# Patient Record
Sex: Female | Born: 1985 | Hispanic: No | Marital: Married | Smoking: Never smoker
Health system: Southern US, Community
[De-identification: ages and names within clinical notes are randomized; demographics above are authoritative.]

## PROBLEM LIST (undated history)

## (undated) DIAGNOSIS — D649 Anemia, unspecified: Secondary | ICD-10-CM

## (undated) HISTORY — PX: NO PAST SURGERIES: SHX2092

---

## 2020-01-27 ENCOUNTER — Other Ambulatory Visit: Payer: Self-pay

## 2020-01-27 ENCOUNTER — Inpatient Hospital Stay (HOSPITAL_COMMUNITY)
Admission: EM | Admit: 2020-01-27 | Discharge: 2020-01-28 | Disposition: A | Discharge status: Home / Self Care | Payer: Commercial Managed Care - PPO | Attending: Obstetrics and Gynecology | Admitting: Obstetrics and Gynecology

## 2020-01-27 ENCOUNTER — Encounter (HOSPITAL_COMMUNITY): Payer: Self-pay | Admitting: Physician Assistant

## 2020-01-27 DIAGNOSIS — Z3A01 Less than 8 weeks gestation of pregnancy: Secondary | ICD-10-CM | POA: Diagnosis not present

## 2020-01-27 DIAGNOSIS — O99611 Diseases of the digestive system complicating pregnancy, first trimester: Secondary | ICD-10-CM

## 2020-01-27 DIAGNOSIS — O211 Hyperemesis gravidarum with metabolic disturbance: Secondary | ICD-10-CM | POA: Diagnosis not present

## 2020-01-27 DIAGNOSIS — Z3A08 8 weeks gestation of pregnancy: Secondary | ICD-10-CM | POA: Insufficient documentation

## 2020-01-27 DIAGNOSIS — R7989 Other specified abnormal findings of blood chemistry: Secondary | ICD-10-CM

## 2020-01-27 DIAGNOSIS — E860 Dehydration: Secondary | ICD-10-CM

## 2020-01-27 DIAGNOSIS — K828 Other specified diseases of gallbladder: Secondary | ICD-10-CM

## 2020-01-27 DIAGNOSIS — R7401 Elevation of levels of liver transaminase levels: Secondary | ICD-10-CM

## 2020-01-27 DIAGNOSIS — R1110 Vomiting, unspecified: Secondary | ICD-10-CM

## 2020-01-27 DIAGNOSIS — O210 Mild hyperemesis gravidarum: Secondary | ICD-10-CM | POA: Diagnosis present

## 2020-01-27 DIAGNOSIS — O26611 Liver and biliary tract disorders in pregnancy, first trimester: Secondary | ICD-10-CM | POA: Insufficient documentation

## 2020-01-27 DIAGNOSIS — E876 Hypokalemia: Secondary | ICD-10-CM

## 2020-01-27 MED ORDER — DEXAMETHASONE SODIUM PHOSPHATE 10 MG/ML IJ SOLN
10.0000 mg | Freq: Once | INTRAMUSCULAR | Status: AC
  Administered 2020-01-27: 10 mg via INTRAVENOUS
  Filled 2020-01-27: qty 1

## 2020-01-27 MED ORDER — ONDANSETRON HCL 4 MG/2ML IJ SOLN
4.0000 mg | Freq: Once | INTRAMUSCULAR | Status: AC
  Administered 2020-01-27: 4 mg via INTRAVENOUS
  Filled 2020-01-27: qty 2

## 2020-01-27 MED ORDER — SODIUM CHLORIDE 0.9 % IV SOLN: INTRAVENOUS | Status: DC

## 2020-01-27 MED ORDER — FAMOTIDINE IN NACL 20-0.9 MG/50ML-% IV SOLN
20.0000 mg | Freq: Once | INTRAVENOUS | Status: AC
  Administered 2020-01-27: 20 mg via INTRAVENOUS
  Filled 2020-01-27: qty 50

## 2020-01-27 NOTE — MAU Provider Note (Signed)
Chief Complaint: Emesis   First Provider Initiated Contact with Patient 01/27/20 2237        SUBJECTIVE HPI: Joann Thompson is a 34 y.o. [id number] at [redacted]w[redacted]d by LMP who presents to maternity admissions reporting nausea and vomiting with some epigastric burning and burning in her throat from vomiting.  Has been taking Bonjesta and Promethazine but has not gotten relief from them.  Denies syncope or dizziness. . She denies vaginal bleeding, vaginal itching/burning, urinary symptoms, h/a, dizziness, or fever/chills.    Emesis  This is a recurrent problem. The current episode started in the past 7 days. The problem has been unchanged. The emesis has an appearance of stomach contents. There has been no fever. Pertinent negatives include no abdominal pain (but has epigastric burning), chills, diarrhea, dizziness, fever, headaches or myalgias. Treatments tried: [place]. The treatment provided no relief.   ED Note: Patient is [id number] approximately 9 weeks who presents today for evaluation of hyperemesis.  She states that she has had morning sickness in her previous pregnancies, however since Monday has had a significant worsening in her morning sickness and is unable to keep down food or water.  She has a burning feeling down her throat into her stomach.  No specific chest pain or shortness of breath. She denies any vaginal bleeding.  She states that her husband called the on-call OB who recommended that she be brought in for evaluation and treatment of possible hyperemesis.  History reviewed. No pertinent past medical history. History reviewed. No pertinent surgical history. Social History   Socioeconomic History  . Marital status: Married    Spouse name: Not on file  . Number of children: Not on file  . Years of education: Not on file  . Highest education level: Not on file  Occupational History  . Not on file  Tobacco Use  . Smoking status: Never Smoker  . Smokeless tobacco: Never Used   Substance and Sexual Activity  . Alcohol use: Not Currently  . Drug use: Never  . Sexual activity: Yes  Other Topics Concern  . Not on file  Social History Narrative  . Not on file   Social Determinants of Health   Financial Resource Strain:   . Difficulty of Paying Living Expenses: Not on file  Food Insecurity:   . Worried About Programme researcher, broadcasting/film/video in the Last Year: Not on file  . Ran Out of Food in the Last Year: Not on file  Transportation Needs:   . Lack of Transportation (Medical): Not on file  . Lack of Transportation (Non-Medical): Not on file  Physical Activity:   . Days of Exercise per Week: Not on file  . Minutes of Exercise per Session: Not on file  Stress:   . Feeling of Stress : Not on file  Social Connections:   . Frequency of Communication with Friends and Family: Not on file  . Frequency of Social Gatherings with Friends and Family: Not on file  . Attends Religious Services: Not on file  . Active Member of Clubs or Organizations: Not on file  . Attends Banker Meetings: Not on file  . Marital Status: Not on file  Intimate Partner Violence:   . Fear of Current or Ex-Partner: Not on file  . Emotionally Abused: Not on file  . Physically Abused: Not on file  . Sexually Abused: Not on file   No current facility-administered medications on file prior to encounter.   Current Outpatient Medications on File Prior  to Encounter  Medication Sig Dispense Refill  . Prenatal Vit-Fe Fumarate-FA (PRENATAL MULTIVITAMIN) TABS tablet Take 1 tablet by mouth daily at 12 noon.    . promethazine (PHENERGAN) 12.5 MG tablet Take by mouth every 6 (six) hours as needed for nausea or vomiting.     No Known Allergies  I have reviewed patient's Past Medical Hx, Surgical Hx, Family Hx, Social Hx, medications and allergies.   ROS:  Review of Systems  Constitutional: Negative for chills and fever.  Respiratory: Negative for shortness of breath.   Gastrointestinal:  Positive for nausea and vomiting. Negative for abdominal pain (but has epigastric burning) and diarrhea.  Genitourinary: Negative for difficulty urinating, dysuria, pelvic pain and vaginal bleeding.  Musculoskeletal: Negative for myalgias.  Neurological: Negative for dizziness, syncope and headaches.   Review of Systems  Other systems negative   Physical Exam  Physical Exam  Vitals:   01/27/20 2107 01/27/20 2141 01/28/20 0238  BP: (!) 140/105 (!) 135/91 115/82  Pulse: (!) 117 (!) 109 90  Resp: 20 17 17   Temp: 98.8 F (37.1 C)    TempSrc: Oral    SpO2: 97% 98% 100%  Weight:  55 kg     Constitutional: Well-developed female in no acute distress. She appear ill and uncomfortable, holding epigastric area at times Cardiovascular: mild tachycardia Respiratory: normal effort, no distress GI: Abd soft, non-tender. Pos BS x 4 MS: Extremities nontender, no edema, normal ROM Neurologic: Alert and oriented x 4.  GU: Neg CVAT.  PELVIC EXAM: deferred  LAB RESULTS Results for orders placed or performed during the hospital encounter of 01/27/20 (from the past 24 hour(s))  Urinalysis, Routine w reflex microscopic     Status: Abnormal   Collection Time: 01/28/20 12:45 AM  Result Value Ref Range   Color, Urine AMBER (A) YELLOW   APPearance HAZY (A) CLEAR   Specific Gravity, Urine 1.031 (H) 1.005 - 1.030   pH 6.0 5.0 - 8.0   Glucose, UA NEGATIVE NEGATIVE mg/dL   Hgb urine dipstick NEGATIVE NEGATIVE   Bilirubin Urine NEGATIVE NEGATIVE   Ketones, ur 80 (A) NEGATIVE mg/dL   Protein, ur 100 (A) NEGATIVE mg/dL   Nitrite NEGATIVE NEGATIVE   Leukocytes,Ua NEGATIVE NEGATIVE   RBC / HPF 0-5 0 - 5 RBC/hpf   WBC, UA 0-5 0 - 5 WBC/hpf   Bacteria, UA RARE (A) NONE SEEN   Squamous Epithelial / LPF 0-5 0 - 5   Mucus PRESENT    Hyaline Casts, UA PRESENT   Comprehensive metabolic panel     Status: Abnormal   Collection Time: 01/28/20 12:57 AM  Result Value Ref Range   Sodium 132 (L) 135 - 145  mmol/L   Potassium 3.0 (L) 3.5 - 5.1 mmol/L   Chloride 95 (L) 98 - 111 mmol/L   CO2 19 (L) 22 - 32 mmol/L   Glucose, Bld 104 (H) 70 - 99 mg/dL   BUN 17 6 - 20 mg/dL   Creatinine, Ser 0.82 0.44 - 1.00 mg/dL   Calcium 9.8 8.9 - 10.3 mg/dL   Total Protein 9.0 (H) 6.5 - 8.1 g/dL   Albumin 4.3 3.5 - 5.0 g/dL   AST 39 15 - 41 U/L   ALT 73 (H) 0 - 44 U/L   Alkaline Phosphatase 77 38 - 126 U/L   Total Bilirubin 3.1 (H) 0.3 - 1.2 mg/dL   GFR calc non Af Amer >60 >60 mL/min   GFR calc Af Amer >60 >60 mL/min  Anion gap 18 (H) 5 - 15     IMAGING US ABDOMEN LIMITED RUQ  Result Date: 01/28/2020 CLINICAL DATA:  Abnormal LFTs with abdominal pain, [redacted] weeks pregnant EXAM: ULTRASOUND ABDOMEN LIMITED RIGHT UPPER QUADRANT COMPARISON:  None. FINDINGS: Gallbladder: Well distended without evidence of cholelithiasis. No wall thickening is seen. Mild gallbladder sludge is noted. Common bile duct: Diameter: 5 mm Liver: No focal lesion identified. Within normal limits in parenchymal echogenicity. Portal vein is patent on color Doppler imaging with normal direction of blood flow towards the liver. Other: None. IMPRESSION: Gallbladder sludge without complicating factors. Electronically Signed   By: Alcide Clever M.D.   On: 01/28/2020 02:23    MAU Management/MDM: IV hydration given x 3 liters We gave her Zofran for antiemetic since she had just taken a dose of Promethazine and Bonjesta earlier I also added Pepcid and Decadron for added benefit of reflux and inflammation She felt significantly better after first liter, but urine was dark brown, so we gave additional fluids We checked a CMET and noted elevated ALT and Bilirubin with mild Hypokalemia Consulted Dr Shawnie Pons who recommended RUQ ultrasound This showed only small amount of gallbladder sludge but otherwise normal  Discussed with patient Will supplement her potassium at home and recommend she have her labs repeated Monday or Tuesday to check for adequacy of  repletion and see if the hepatic numbers improve.  They could be elevated due to dehydration and persistent vomiting She is to call office today to schedule that Recommend adding Zofran as backup if Bonjesta and Promethazine fail.  Discussed she may improve simply with rehydration and repletion of K+.    ASSESSMENT SIngle intrauterine pregnancy at [redacted]w[redacted]d Hyperemesis Dehydration Elevated ALT and bilurubin, uncertain cause, probably dehydration Mild hypokalemia, due to loss with vomiting Mild gallbladder sludge  PLAN Discharge home Rx K-Dur daily x4 days Rx Zofran ODT to be used only of other two meds do not work Arboriculturist slowly  Call office today to schedule lab recheck early next week  Pt stable at time of discharge. Encouraged to return here or to other Urgent Care/ED if she develops worsening of symptoms, increase in pain, fever, or other concerning symptoms.    Wynelle Bourgeois CNM, MSN Certified Nurse-Midwife 01/27/2020  10:37 PM

## 2020-01-27 NOTE — ED Provider Notes (Signed)
MSE was initiated and I personally evaluated the patient and placed orders (if any) at  9:14 PM on January 27, 2020.  The patient appears stable so that the remainder of the MSE may be completed by another provider.  Patient is [id number] approximately 9 weeks who presents today for evaluation of hyperemesis.  She states that she has had morning sickness in her previous pregnancies, however since Monday has had a significant worsening in her morning sickness and is unable to keep down food or water.  She has a burning feeling down her throat into her stomach.  No specific chest pain or shortness of breath. She denies any vaginal bleeding.  She states that her husband called the on-call OB who recommended that she be brought in for evaluation and treatment of possible hyperemesis.  She is tachycardic here however does not feel presyncopal.  Her blood pressure is also elevated at 140/105.  Patient is awake and alert, mentating appropriately and able to answer questions appropriately.  While she appears to feel unwell she is in no acute distress.  At this point patient appears stable for transfer to MAU for further evaluation.    Norman Clay 01/27/20 2117    Terrilee Files, MD 01/28/20 1019

## 2020-01-27 NOTE — MAU Note (Signed)
States she is [redacted] weeks pregnant.  Unable to keep any food/drink down since Monday.  Also reports esophageal burning.  She was prescribed phenergan and bonjesta.  Took phenergan for the first time at 2000 and bonjesta BID since Tuesday without relief.  No VB or abdominal pain.

## 2020-01-28 ENCOUNTER — Inpatient Hospital Stay (HOSPITAL_COMMUNITY): Payer: Commercial Managed Care - PPO

## 2020-01-28 LAB — URINALYSIS, ROUTINE W REFLEX MICROSCOPIC
Bilirubin Urine: NEGATIVE
Glucose, UA: NEGATIVE mg/dL
Hgb urine dipstick: NEGATIVE
Ketones, ur: 80 mg/dL — AB
Leukocytes,Ua: NEGATIVE
Nitrite: NEGATIVE
Protein, ur: 100 mg/dL — AB
Specific Gravity, Urine: 1.031 — ABNORMAL HIGH (ref 1.005–1.030)
pH: 6 (ref 5.0–8.0)

## 2020-01-28 LAB — COMPREHENSIVE METABOLIC PANEL
ALT: 73 U/L — ABNORMAL HIGH (ref 0–44)
AST: 39 U/L (ref 15–41)
Albumin: 4.3 g/dL (ref 3.5–5.0)
Alkaline Phosphatase: 77 U/L (ref 38–126)
Anion gap: 18 — ABNORMAL HIGH (ref 5–15)
BUN: 17 mg/dL (ref 6–20)
CO2: 19 mmol/L — ABNORMAL LOW (ref 22–32)
Calcium: 9.8 mg/dL (ref 8.9–10.3)
Chloride: 95 mmol/L — ABNORMAL LOW (ref 98–111)
Creatinine, Ser: 0.82 mg/dL (ref 0.44–1.00)
GFR calc Af Amer: >60 mL/min (ref >60)
GFR calc non Af Amer: >60 mL/min (ref >60)
Glucose, Bld: 104 mg/dL — ABNORMAL HIGH (ref 70–99)
Potassium: 3 mmol/L — ABNORMAL LOW (ref 3.5–5.1)
Sodium: 132 mmol/L — ABNORMAL LOW (ref 135–145)
Total Bilirubin: 3.1 mg/dL — ABNORMAL HIGH (ref 0.3–1.2)
Total Protein: 9 g/dL — ABNORMAL HIGH (ref 6.5–8.1)

## 2020-01-28 MED ORDER — POTASSIUM CHLORIDE CRYS ER 20 MEQ PO TBCR: 20.0000 meq | EXTENDED_RELEASE_TABLET | Freq: Every day | ORAL | 0 refills | Status: AC

## 2020-01-28 MED ORDER — ONDANSETRON 4 MG PO TBDP: 4.0000 mg | ORAL_TABLET | Freq: Four times a day (QID) | ORAL | 0 refills | Status: AC | PRN

## 2020-01-28 NOTE — Discharge Instructions (Signed)
Hypokalemia Hypokalemia means that the amount of potassium in the blood is lower than normal. Potassium is a chemical (electrolyte) that helps regulate the amount of fluid in the body. It also stimulates muscle tightening (contraction) and helps nerves work properly. Normally, most of the body's potassium is inside cells, and only a very small amount is in the blood. Because the amount in the blood is so small, minor changes to potassium levels in the blood can be life-threatening. What are the causes? This condition may be caused by:  Antibiotic medicine.  Diarrhea or vomiting. Taking too much of a medicine that helps you have a bowel movement (laxative) can cause diarrhea and lead to hypokalemia.  Chronic kidney disease (CKD).  Medicines that help the body get rid of excess fluid (diuretics).  Eating disorders, such as bulimia.  Low magnesium levels in the body.  Sweating a lot. What are the signs or symptoms? Symptoms of this condition include:  Weakness.  Constipation.  Fatigue.  Muscle cramps.  Mental confusion.  Skipped heartbeats or irregular heartbeat (palpitations).  Tingling or numbness. How is this diagnosed? This condition is diagnosed with a blood test. How is this treated? This condition may be treated by:  Taking potassium supplements by mouth.  Adjusting the medicines that you take.  Eating more foods that contain a lot of potassium. If your potassium level is very low, you may need to get potassium through an IV and be monitored in the hospital. Follow these instructions at home:   Take over-the-counter and prescription medicines only as told by your health care provider. This includes vitamins and supplements.  Eat a healthy diet. A healthy diet includes fresh fruits and vegetables, whole grains, healthy fats, and lean proteins.  If instructed, eat more foods that contain a lot of potassium. This includes: ? Nuts, such as peanuts and  pistachios. ? Seeds, such as sunflower seeds and pumpkin seeds. ? Peas, lentils, and lima beans. ? Whole grain and bran cereals and breads. ? Fresh fruits and vegetables, such as apricots, avocado, bananas, cantaloupe, kiwi, oranges, tomatoes, asparagus, and potatoes. ? Orange juice. ? Tomato juice. ? Red meats. ? Yogurt.  Keep all follow-up visits as told by your health care provider. This is important. Contact a health care provider if you:  Have weakness that gets worse.  Feel your heart pounding or racing.  Vomit.  Have diarrhea.  Have diabetes (diabetes mellitus) and you have trouble keeping your blood sugar (glucose) in your target range. Get help right away if you:  Have chest pain.  Have shortness of breath.  Have vomiting or diarrhea that lasts for more than 2 days.  Faint. Summary  Dehydration, Adult Dehydration is a condition in which there is not enough water or other fluids in the body. This happens when a person loses more fluids than he or she takes in. Important organs, such as the kidneys, brain, and heart, cannot function without a proper amount of fluids. Any loss of fluids from the body can lead to dehydration. Dehydration can be mild, moderate, or severe. It should be treated right away to prevent it from becoming severe. What are the causes? Dehydration may be caused by:  Conditions that cause loss of water or other fluids, such as diarrhea, vomiting, or sweating or urinating a lot.  Not drinking enough fluids, especially when you are ill or doing activities that require a lot of energy.  Other illnesses and conditions, such as fever or infection.  Certain  medicines, such as medicines that remove excess fluid from the body (diuretics).  Lack of safe drinking water.  Not being able to get enough water and food. What increases the risk? The following factors may make you more likely to develop this condition:  Having a long-term (chronic)  illness that has not been treated properly, such as diabetes, heart disease, or kidney disease.  Being 43 years of age or older.  Having a disability.  Living in a place that is high in altitude, where thinner, drier air causes more fluid loss.  Doing exercises that put stress on your body for a long time (endurance sports). What are the signs or symptoms? Symptoms of dehydration depend on how severe it is. Mild or moderate dehydration  Thirst.  Dry lips or dry mouth.  Dizziness or light-headedness, especially when standing up from a seated position.  Muscle cramps.  Dark urine. Urine may be the color of tea.  Less urine or tears produced than usual.  Headache. Severe dehydration  Changes in skin. Your skin may be cold and clammy, blotchy, or pale. Your skin also may not return to normal after being lightly pinched and released.  Little or no tears, urine, or sweat.  Changes in vital signs, such as rapid breathing and low blood pressure. Your pulse may be weak or may be faster than 100 beats a minute when you are sitting still.  Other changes, such as: ? Feeling very thirsty. ? Sunken eyes. ? Cold hands and feet. ? Confusion. ? Being very tired (lethargic) or having trouble waking from sleep. ? Short-term weight loss. ? Loss of consciousness. How is this diagnosed? This condition is diagnosed based on your symptoms and a physical exam. You may have blood and urine tests to help confirm the diagnosis. How is this treated? Treatment for this condition depends on how severe it is. Treatment should be started right away. Do not wait until dehydration becomes severe. Severe dehydration is an emergency and needs to be treated in a hospital.  Mild or moderate dehydration can be treated at home. You may be asked to: ? Drink more fluids. ? Drink an oral rehydration solution (ORS). This drink helps restore proper amounts of fluids and salts and minerals in the blood  (electrolytes).  Severe dehydration can be treated: ? With IV fluids. ? By correcting abnormal levels of electrolytes. This is often done by giving electrolytes through a tube that is passed through your nose and into your stomach (nasogastric tube, or NG tube). ? By treating the underlying cause of dehydration. Follow these instructions at home: Oral rehydration solution If told by your health care provider, drink an ORS:  Make an ORS by following instructions on the package.  Start by drinking small amounts, about  cup (120 mL) every 5-10 minutes.  Slowly increase how much you drink until you have taken the amount recommended by your health care provider. Eating and drinking         Drink enough clear fluid to keep your urine pale yellow. If you were told to drink an ORS, finish the ORS first and then start slowly drinking other clear fluids. Drink fluids such as: ? Water. Do not drink only water. Doing that can lead to hyponatremia, which is having too little salt (sodium) in the body. ? Water from ice chips you suck on. ? Fruit juice that you have added water to (diluted fruit juice). ? Low-calorie sports drinks.  Eat foods that contain a  healthy balance of electrolytes, such as bananas, oranges, potatoes, tomatoes, and spinach.  Do not drink alcohol.  Avoid the following: ? Drinks that contain a lot of sugar. These include high-calorie sports drinks, fruit juice that is not diluted, and soda. ? Caffeine. ? Foods that are greasy or contain a lot of fat or sugar. General instructions  Take over-the-counter and prescription medicines only as told by your health care provider.  Do not take sodium tablets. Doing that can lead to having too much sodium in the body (hypernatremia).  Return to your normal activities as told by your health care provider. Ask your health care provider what activities are safe for you.  Keep all follow-up visits as told by your health care  provider. This is important. Contact a health care provider if:  You have muscle cramps, pain, or discomfort, such as: ? Pain in your abdomen and the pain gets worse or stays in one area (localizes). ? Stiff neck.  You have a rash.  You are more irritable than usual.  You are sleepier or have a harder time waking than usual.  You feel weak or dizzy.  You feel very thirsty. Get help right away if you have:  Any symptoms of severe dehydration.  Symptoms of vomiting, such as: ? You cannot eat or drink without vomiting. ? Vomiting gets worse or does not go away. ? Vomit includes blood or green matter (bile).  Symptoms that get worse with treatment.  A fever.  A severe headache.  Problems with urination or bowel movements, such as: ? Diarrhea that gets worse or does not go away. ? Blood in your stool (feces). This may cause stool to look black and tarry. ? Not urinating, or urinating only a small amount of very dark urine, within 6-8 hours.  Trouble breathing. These symptoms may represent a serious problem that is an emergency. Do not wait to see if the symptoms will go away. Get medical help right away. Call your local emergency services (911 in the [place] Do not drive yourself to the hospital. Summary  Dehydration is a condition in which there is not enough water or other fluids in the body. This happens when a person loses more fluids than he or she takes in.  Treatment for this condition depends on how severe it is. Treatment should be started right away. Do not wait until dehydration becomes severe.  Drink enough clear fluid to keep your urine pale yellow. If you were told to drink an oral rehydration solution (ORS), finish the ORS first and then start slowly drinking other clear fluids.  Take over-the-counter and prescription medicines only as told by your health care provider.  Get help right away if you have any symptoms of severe dehydration. This information is  not intended to replace advice given to you by your health care provider. Make sure you discuss any questions you have with your health care provider. Document Revised: 07/22/2019 Document Reviewed: 07/22/2019 Elsevier Patient Education  2020 [employer].   Hypokalemia means that the amount of potassium in the blood is lower than normal.  This condition is diagnosed with a blood test.  Hypokalemia may be treated by taking potassium supplements, adjusting the medicines that you take, or eating more foods that are high in potassium.  If your potassium level is very low, you may need to get potassium through an IV and be monitored in the hospital. This information is not intended to replace advice given to you  by your health care provider. Make sure you discuss any questions you have with your health care provider. Document Revised: 07/22/2018 Document Reviewed: 07/22/2018 Elsevier Patient Education  2020 [employer]  Hyperemesis Gravidarum Hyperemesis gravidarum is a severe form of nausea and vomiting that happens during pregnancy. Hyperemesis is worse than morning sickness. It may cause you to have nausea or vomiting all day for many days. It may keep you from eating and drinking enough food and liquids, which can lead to dehydration, malnutrition, and weight loss. Hyperemesis usually occurs during the first half (the first 20 weeks) of pregnancy. It often goes away once a woman is in her second half of pregnancy. However, sometimes hyperemesis continues through an entire pregnancy. What are the causes? The cause of this condition is not known. It may be related to changes in chemicals (hormones) in the body during pregnancy, such as the high level of pregnancy hormone (human chorionic gonadotropin) or the increase in the female sex hormone (estrogen). What are the signs or symptoms? Symptoms of this condition include:  Nausea that does not go away.  Vomiting that does not allow you to  keep any food down.  Weight loss.  Body fluid loss (dehydration).  Having no desire to eat, or not liking food that you have previously enjoyed. How is this diagnosed? This condition may be diagnosed based on:  A physical exam.  Your medical history.  Your symptoms.  Blood tests.  Urine tests. How is this treated? This condition is managed by controlling symptoms. This may include:  Following an eating plan. This can help lessen nausea and vomiting.  Taking prescription medicines. An eating plan and medicines are often used together to help control symptoms. If medicines do not help relieve nausea and vomiting, you may need to receive fluids through an IV at the hospital. Follow these instructions at home: Eating and drinking   Avoid the following: ? Drinking fluids with meals. Try not to drink anything during the 30 minutes before and after your meals. ? Drinking more than 1 cup of fluid at a time. ? Eating foods that trigger your symptoms. These may include spicy foods, coffee, high-fat foods, very sweet foods, and acidic foods. ? Skipping meals. Nausea can be more intense on an empty stomach. If you cannot tolerate food, do not force it. Try sucking on ice chips or other frozen items and make up for missed calories later. ? Lying down within 2 hours after eating. ? Being exposed to environmental triggers. These may include food smells, smoky rooms, closed spaces, rooms with strong smells, warm or humid places, overly loud and noisy rooms, and rooms with motion or flickering lights. Try eating meals in a well-ventilated area that is free of strong smells. ? Quick and sudden changes in your movement. ? Taking iron pills and multivitamins that contain iron. If you take prescription iron pills, do not stop taking them unless your health care provider approves. ? Preparing food. The smell of food can spoil your appetite or trigger nausea.  To help relieve your  symptoms: ? Listen to your body. Everyone is different and has different preferences. Find what works best for you. ? Eat and drink slowly. ? Eat 5-6 small meals daily instead of 3 large meals. Eating small meals and snacks can help you avoid an empty stomach. ? In the morning, before getting out of bed, eat a couple of crackers to avoid moving around on an empty stomach. ? Try eating starchy  foods as these are usually tolerated well. Examples include cereal, toast, bread, potatoes, pasta, rice, and pretzels. ? Include at least 1 serving of protein with your meals and snacks. Protein options include lean meats, poultry, seafood, beans, nuts, nut butters, eggs, cheese, and yogurt. ? Try eating a protein-rich snack before bed. Examples of a protein-rick snack include cheese and crackers or a peanut butter sandwich made with 1 slice of whole-wheat bread and 1 tsp (5 g) of peanut butter. ? Eat or suck on things that have ginger in them. It may help relieve nausea. Add  tsp ground ginger to hot tea or choose ginger tea. ? Try drinking 100% fruit juice or an electrolyte drink. An electrolyte drink contains sodium, potassium, and chloride. ? Drink fluids that are cold, clear, and carbonated or sour. Examples include lemonade, ginger ale, lemon-lime soda, ice water, and sparkling water. ? Brush your teeth or use a mouth rinse after meals. ? Talk with your health care provider about starting a supplement of vitamin B6. General instructions  Take over-the-counter and prescription medicines only as told by your health care provider.  Follow instructions from your health care provider about eating or drinking restrictions.  Continue to take your prenatal vitamins as told by your health care provider. If you are having trouble taking your prenatal vitamins, talk with your health care provider about different options.  Keep all follow-up and pre-birth (prenatal) visits as told by your health care provider.  This is important. Contact a health care provider if:  You have pain in your abdomen.  You have a severe headache.  You have vision problems.  You are losing weight.  You feel weak or dizzy. Get help right away if:  You cannot drink fluids without vomiting.  You vomit blood.  You have constant nausea and vomiting.  You are very weak.  You faint.  You have a fever and your symptoms suddenly get worse. Summary  Hyperemesis gravidarum is a severe form of nausea and vomiting that happens during pregnancy.  Making some changes to your eating habits may help relieve nausea and vomiting.  This condition may be managed with medicine.  If medicines do not help relieve nausea and vomiting, you may need to receive fluids through an IV at the hospital. This information is not intended to replace advice given to you by your health care provider. Make sure you discuss any questions you have with your health care provider. Document Revised: 12/29/2017 Document Reviewed: 08/07/2016 Elsevier Patient Education  [place].

## 2020-02-27 ENCOUNTER — Inpatient Hospital Stay (HOSPITAL_COMMUNITY)
Admission: AD | Admit: 2020-02-27 | Discharge: 2020-02-27 | Disposition: A | Discharge status: Home / Self Care | Payer: Commercial Managed Care - PPO | Attending: Obstetrics and Gynecology | Admitting: Obstetrics and Gynecology

## 2020-02-27 ENCOUNTER — Other Ambulatory Visit: Payer: Self-pay

## 2020-02-27 ENCOUNTER — Encounter (HOSPITAL_COMMUNITY): Payer: Self-pay | Admitting: Obstetrics and Gynecology

## 2020-02-27 DIAGNOSIS — Z3A12 12 weeks gestation of pregnancy: Secondary | ICD-10-CM | POA: Diagnosis not present

## 2020-02-27 DIAGNOSIS — O09521 Supervision of elderly multigravida, first trimester: Secondary | ICD-10-CM | POA: Diagnosis not present

## 2020-02-27 DIAGNOSIS — O209 Hemorrhage in early pregnancy, unspecified: Secondary | ICD-10-CM | POA: Diagnosis not present

## 2020-02-27 LAB — URINALYSIS, ROUTINE W REFLEX MICROSCOPIC
Bacteria, UA: NONE SEEN
Bilirubin Urine: NEGATIVE
Glucose, UA: NEGATIVE mg/dL
Ketones, ur: 20 mg/dL — AB
Leukocytes,Ua: NEGATIVE
Nitrite: NEGATIVE
Protein, ur: 100 mg/dL — AB
RBC / HPF: >50 RBC/hpf — ABNORMAL HIGH (ref 0–5)
Specific Gravity, Urine: 1.014 (ref 1.005–1.030)
pH: 6 (ref 5.0–8.0)

## 2020-02-27 LAB — CBC
HCT: 31.3 % — ABNORMAL LOW (ref 36.0–46.0)
Hemoglobin: 10.1 g/dL — ABNORMAL LOW (ref 12.0–15.0)
MCH: 22.3 pg — ABNORMAL LOW (ref 26.0–34.0)
MCHC: 32.3 g/dL (ref 30.0–36.0)
MCV: 69.1 fL — ABNORMAL LOW (ref 80.0–100.0)
Platelets: 321 10*3/uL (ref 150–400)
RBC: 4.53 MIL/uL (ref 3.87–5.11)
RDW: 19.9 % — ABNORMAL HIGH (ref 11.5–15.5)
WBC: 10.5 10*3/uL (ref 4.0–10.5)
nRBC: 0 % (ref 0.0–0.2)

## 2020-02-27 LAB — WET PREP, GENITAL
Sperm: NONE SEEN
Trich, Wet Prep: NONE SEEN
Yeast Wet Prep HPF POC: NONE SEEN

## 2020-02-27 LAB — POCT PREGNANCY, URINE: Preg Test, Ur: POSITIVE — AB

## 2020-02-27 LAB — ABO/RH: ABO/RH(D): O POS

## 2020-02-27 LAB — HCG, QUANTITATIVE, PREGNANCY: hCG, Beta Chain, Quant, S: 155601 m[IU]/mL — ABNORMAL HIGH (ref <5)

## 2020-02-27 NOTE — Discharge Instructions (Signed)

## 2020-02-27 NOTE — MAU Note (Signed)
Getting ready for bed and felt cramps and went to bathroom and noticed pink then bright red vag spotting when wiped approx 30 min ago.  Last intercourse was yesterday morning. [redacted] weeks pregnant and sees Dr. Tenny Craw.

## 2020-02-27 NOTE — MAU Provider Note (Signed)
History     CSN: [id number]  Arrival date and time: 02/27/20 2104   None     Chief Complaint  Patient presents with  . Vaginal Bleeding  . Abdominal Cramping   HPI Joann Thompson is a 34 y.o. [id number] at [redacted]w[redacted]d who presents to MAU with chief complaint of vaginal bleeding. This is a new problem, onset this evening at approximately 2045 hours. She endorses seeing multiple bright red clots. She denies saturating a menstrual pad. She also denies dysuria, abdominal tenderness, weakness, fever or syncope.  Patient also c/o lower abdominal cramping. This is a new problem, onset coinciding with onset of vaginal bleeding. She rates her pain as 1/10. She denies aggravating or alleviating factors. She has not taken medication or tried other treatments for this complaint.   Patient states she was visiting with her sister this afternoon around 3pm and one of her sister's toddler twins ran towards the street. Patient states she sprinted toward the 36 year old and picked them up to prevent them from running into traffic.Most recent sexual intercourse yesterday morning 02/26/2020.  Patient is s/p New OB appointment with [facility] and her next appointment is 03/14/2020  OB History    Gravida  7   Para  3   Term      Preterm      AB  2   Living  3     SAB      TAB      Ectopic      Multiple      Live Births              No past medical history on file.  No past surgical history on file.  No family history on file.  Social History   Tobacco Use  . Smoking status: Never Smoker  . Smokeless tobacco: Never Used  Substance Use Topics  . Alcohol use: Not Currently  . Drug use: Never    Allergies: No Known Allergies  Medications Prior to Admission  Medication Sig Dispense Refill Last Dose  . ondansetron (ZOFRAN ODT) 4 MG disintegrating tablet Take 1 tablet (4 mg total) by mouth every 6 (six) hours as needed for nausea. 20 tablet 0   . potassium chloride SA (KLOR-CON)  20 MEQ tablet Take 1 tablet (20 mEq total) by mouth daily. 4 tablet 0   . Prenatal Vit-Fe Fumarate-FA (PRENATAL MULTIVITAMIN) TABS tablet Take 1 tablet by mouth daily at 12 noon.     . promethazine (PHENERGAN) 12.5 MG tablet Take by mouth every 6 (six) hours as needed for nausea or vomiting.       Review of Systems  Constitutional: Negative for chills, fatigue and fever.  Gastrointestinal: Positive for abdominal pain.  Genitourinary: Positive for vaginal bleeding. Negative for difficulty urinating, dysuria and hematuria.  Musculoskeletal: Negative for back pain.  Neurological: Negative for dizziness, syncope and weakness.  All other systems reviewed and are negative.  Physical Exam   Blood pressure 121/82, pulse (!) 104, temperature 99.2 F (37.3 C), temperature source Oral, resp. rate 16, height 4\' 11"  (1.499 m), weight 55.8 kg.  Physical Exam  Nursing note and vitals reviewed. Constitutional: She is oriented to person, place, and time. She appears well-developed and well-nourished.  Cardiovascular: Normal rate and normal heart sounds.  Respiratory: Effort normal and breath sounds normal.  GI: Soft. She exhibits no distension. There is no abdominal tenderness. There is no rebound and no guarding.  Genitourinary:    Uterus normal.  Vaginal discharge present.     Genitourinary Comments: Pelvic exam: External genitalia normal, vaginal walls pink and well rugated, scant dark red blood removed with fox swab x 1, cervix visually closed, no lesions noted. Bimanual: cervix 0/thick/posterior, neg CMT, uterus nontender, no adnexal tenderness noted.    Neurological: She is alert and oriented to person, place, and time.  Skin: Skin is warm and dry.  Psychiatric: She has a normal mood and affect. Her behavior is normal. Judgment and thought content normal.    MAU Course/MDM  Procedures: speculum exam, bedside ultrasound  --Viability ultrasound results visible in Care  Everywhere  --Bedside Ultrasound for FHR check: FHT 165, very active fetal movement during scan. Patient informed that the ultrasound is considered a limited obstetric ultrasound and is not intended to be a complete ultrasound exam.  Patient also informed that the ultrasound is not being completed with the intent of assessing for fetal or placental anomalies or any pelvic abnormalities.  Explained that the purpose of today's ultrasound is to assess for fetal heart rate.  Patient acknowledges the purpose of the exam and the limitations of the study.    --Advised pelvic rest until cleared by [facility] provider due to bleeding episode and history of losses.   Patient Vitals for the past 24 hrs:  BP Temp Temp src Pulse Resp Height Weight  02/27/20 2231 126/72 -- -- 95 -- -- --  02/27/20 2146 124/79 -- -- (!) 101 -- -- --  02/27/20 2124 121/82 99.2 F (37.3 C) Oral (!) 104 16 4\' 11"  (1.499 m) 55.8 kg   Results for orders placed or performed during the hospital encounter of 02/27/20 (from the past 24 hour(s))  Pregnancy, urine POC     Status: Abnormal   Collection Time: 02/27/20  9:32 PM  Result Value Ref Range   Preg Test, Ur POSITIVE (A) NEGATIVE  Urinalysis, Routine w reflex microscopic     Status: Abnormal   Collection Time: 02/27/20  9:36 PM  Result Value Ref Range   Color, Urine PINK (A) YELLOW   APPearance CLOUDY (A) CLEAR   Specific Gravity, Urine 1.014 1.005 - 1.030   pH 6.0 5.0 - 8.0   Glucose, UA NEGATIVE NEGATIVE mg/dL   Hgb urine dipstick LARGE (A) NEGATIVE   Bilirubin Urine NEGATIVE NEGATIVE   Ketones, ur 20 (A) NEGATIVE mg/dL   Protein, ur 04/28/20 (A) NEGATIVE mg/dL   Nitrite NEGATIVE NEGATIVE   Leukocytes,Ua NEGATIVE NEGATIVE   RBC / HPF >50 (H) 0 - 5 RBC/hpf   WBC, UA 6-10 0 - 5 WBC/hpf   Bacteria, UA NONE SEEN NONE SEEN   Squamous Epithelial / LPF 21-50 0 - 5   Mucus PRESENT   CBC     Status: Abnormal   Collection Time: 02/27/20  9:40 PM  Result Value Ref Range   WBC 10.5  4.0 - 10.5 K/uL   RBC 4.53 3.87 - 5.11 MIL/uL   Hemoglobin 10.1 (L) 12.0 - 15.0 g/dL   HCT 04/28/20 (L) 28.7 - 86.7 %   MCV 69.1 (L) 80.0 - 100.0 fL   MCH 22.3 (L) 26.0 - 34.0 pg   MCHC 32.3 30.0 - 36.0 g/dL   RDW 67.2 (H) 09.4 - 70.9 %   Platelets 321 150 - 400 K/uL   nRBC 0.0 0.0 - 0.2 %  ABO/Rh     Status: None   Collection Time: 02/27/20  9:40 PM  Result Value Ref Range   ABO/RH(D) O POS  No rh immune globuloin      NOT A RH IMMUNE GLOBULIN CANDIDATE, PT RH POSITIVE Performed at [facility], [address] [place], [place] [address]   Wet prep, genital     Status: Abnormal   Collection Time: 02/27/20  9:53 PM   Specimen: Cervix  Result Value Ref Range   Yeast Wet Prep HPF POC NONE SEEN NONE SEEN   Trich, Wet Prep NONE SEEN NONE SEEN   Clue Cells Wet Prep HPF POC PRESENT (A) NONE SEEN   WBC, Wet Prep HPF POC MODERATE (A) NONE SEEN   Sperm NONE SEEN    Assessment and Plan  --34 y.o. [id number] at [redacted]w[redacted]d  --Episode of vaginal bleeding, VSS --Reassuring fetal surveillance on BSUS --O POS --Discharge home in stable condition with bleeding precautions, pelvic rest  F/U: --Next OB appt [facility] 03/14/2020  Calvert Cantor, CNM 02/27/2020, 10:36 PM

## 2020-02-28 LAB — GC/CHLAMYDIA PROBE AMP (~~LOC~~) NOT AT ARMC
Chlamydia: NEGATIVE
Comment: NEGATIVE
Comment: NORMAL
Neisseria Gonorrhea: NEGATIVE

## 2020-05-24 ENCOUNTER — Encounter (HOSPITAL_COMMUNITY): Payer: Self-pay | Admitting: Obstetrics and Gynecology

## 2020-05-24 ENCOUNTER — Inpatient Hospital Stay (HOSPITAL_COMMUNITY)
Admission: AD | Admit: 2020-05-24 | Discharge: 2020-05-24 | Disposition: A | Discharge status: Home / Self Care | Payer: Commercial Managed Care - PPO | Attending: Obstetrics | Admitting: Obstetrics

## 2020-05-24 ENCOUNTER — Other Ambulatory Visit: Payer: Self-pay

## 2020-05-24 DIAGNOSIS — O99013 Anemia complicating pregnancy, third trimester: Secondary | ICD-10-CM | POA: Insufficient documentation

## 2020-05-24 DIAGNOSIS — Z8759 Personal history of other complications of pregnancy, childbirth and the puerperium: Secondary | ICD-10-CM | POA: Diagnosis not present

## 2020-05-24 DIAGNOSIS — O1412 Severe pre-eclampsia, second trimester: Secondary | ICD-10-CM

## 2020-05-24 DIAGNOSIS — O26893 Other specified pregnancy related conditions, third trimester: Secondary | ICD-10-CM | POA: Insufficient documentation

## 2020-05-24 DIAGNOSIS — Z79899 Other long term (current) drug therapy: Secondary | ICD-10-CM | POA: Insufficient documentation

## 2020-05-24 DIAGNOSIS — Z3A37 37 weeks gestation of pregnancy: Secondary | ICD-10-CM | POA: Diagnosis not present

## 2020-05-24 DIAGNOSIS — M25512 Pain in left shoulder: Secondary | ICD-10-CM | POA: Insufficient documentation

## 2020-05-24 DIAGNOSIS — D649 Anemia, unspecified: Secondary | ICD-10-CM | POA: Insufficient documentation

## 2020-05-24 DIAGNOSIS — Z7982 Long term (current) use of aspirin: Secondary | ICD-10-CM | POA: Insufficient documentation

## 2020-05-24 DIAGNOSIS — Z20822 Contact with and (suspected) exposure to covid-19: Secondary | ICD-10-CM | POA: Insufficient documentation

## 2020-05-24 DIAGNOSIS — O09523 Supervision of elderly multigravida, third trimester: Secondary | ICD-10-CM | POA: Insufficient documentation

## 2020-05-24 DIAGNOSIS — O1413 Severe pre-eclampsia, third trimester: Secondary | ICD-10-CM | POA: Diagnosis not present

## 2020-05-24 DIAGNOSIS — R030 Elevated blood-pressure reading, without diagnosis of hypertension: Secondary | ICD-10-CM | POA: Insufficient documentation

## 2020-05-24 DIAGNOSIS — O09292 Supervision of pregnancy with other poor reproductive or obstetric history, second trimester: Secondary | ICD-10-CM | POA: Diagnosis not present

## 2020-05-24 HISTORY — DX: Anemia, unspecified: D64.9

## 2020-05-24 LAB — SARS CORONAVIRUS 2 BY RT PCR (HOSPITAL ORDER, PERFORMED IN ~~LOC~~ HOSPITAL LAB): SARS Coronavirus 2: NEGATIVE

## 2020-05-24 LAB — URINALYSIS, ROUTINE W REFLEX MICROSCOPIC
Bilirubin Urine: NEGATIVE
Glucose, UA: 50 mg/dL — AB
Hgb urine dipstick: NEGATIVE
Ketones, ur: NEGATIVE mg/dL
Leukocytes,Ua: NEGATIVE
Nitrite: NEGATIVE
Protein, ur: >=300 mg/dL — AB
Specific Gravity, Urine: 1.018 (ref 1.005–1.030)
pH: 6 (ref 5.0–8.0)

## 2020-05-24 LAB — PROTEIN / CREATININE RATIO, URINE
Creatinine, Urine: 114.56 mg/dL
Protein Creatinine Ratio: 9.86 mg/mg{Cre} — ABNORMAL HIGH (ref 0.00–0.15)
Total Protein, Urine: 1130 mg/dL

## 2020-05-24 LAB — COMPREHENSIVE METABOLIC PANEL
ALT: 17 U/L (ref 0–44)
AST: 20 U/L (ref 15–41)
Albumin: 2.1 g/dL — ABNORMAL LOW (ref 3.5–5.0)
Alkaline Phosphatase: 106 U/L (ref 38–126)
Anion gap: 11 (ref 5–15)
BUN: 12 mg/dL (ref 6–20)
CO2: 20 mmol/L — ABNORMAL LOW (ref 22–32)
Calcium: 8.3 mg/dL — ABNORMAL LOW (ref 8.9–10.3)
Chloride: 106 mmol/L (ref 98–111)
Creatinine, Ser: 0.92 mg/dL (ref 0.44–1.00)
GFR calc Af Amer: >60 mL/min (ref >60)
GFR calc non Af Amer: >60 mL/min (ref >60)
Glucose, Bld: 90 mg/dL (ref 70–99)
Potassium: 3.9 mmol/L (ref 3.5–5.1)
Sodium: 137 mmol/L (ref 135–145)
Total Bilirubin: 0.9 mg/dL (ref 0.3–1.2)
Total Protein: 5.7 g/dL — ABNORMAL LOW (ref 6.5–8.1)

## 2020-05-24 LAB — CBC
HCT: 30.4 % — ABNORMAL LOW (ref 36.0–46.0)
Hemoglobin: 9.7 g/dL — ABNORMAL LOW (ref 12.0–15.0)
MCH: 23.4 pg — ABNORMAL LOW (ref 26.0–34.0)
MCHC: 31.9 g/dL (ref 30.0–36.0)
MCV: 73.4 fL — ABNORMAL LOW (ref 80.0–100.0)
Platelets: 280 10*3/uL (ref 150–400)
RBC: 4.14 MIL/uL (ref 3.87–5.11)
RDW: 19.5 % — ABNORMAL HIGH (ref 11.5–15.5)
WBC: 9.4 10*3/uL (ref 4.0–10.5)
nRBC: 0.4 % — ABNORMAL HIGH (ref 0.0–0.2)

## 2020-05-24 MED ORDER — LACTATED RINGERS IV SOLN: INTRAVENOUS | Status: DC

## 2020-05-24 MED ORDER — LABETALOL HCL 5 MG/ML IV SOLN
40.0000 mg | INTRAVENOUS | Status: DC | PRN
  Administered 2020-05-24: 40 mg via INTRAVENOUS
  Filled 2020-05-24: qty 8

## 2020-05-24 MED ORDER — LABETALOL HCL 5 MG/ML IV SOLN
INTRAVENOUS | Status: AC
  Administered 2020-05-24: 20 mg via INTRAVENOUS
  Filled 2020-05-24: qty 4

## 2020-05-24 MED ORDER — LABETALOL HCL 5 MG/ML IV SOLN: 20.0000 mg | INTRAVENOUS | Status: DC | PRN

## 2020-05-24 MED ORDER — BETAMETHASONE SOD PHOS & ACET 6 (3-3) MG/ML IJ SUSP
12.0000 mg | INTRAMUSCULAR | Status: DC
  Administered 2020-05-24: 12 mg via INTRAMUSCULAR
  Filled 2020-05-24: qty 5

## 2020-05-24 MED ORDER — MAGNESIUM SULFATE 40 GM/1000ML IV SOLN
2.0000 g/h | INTRAVENOUS | Status: DC
  Administered 2020-05-24: 2 g/h via INTRAVENOUS
  Filled 2020-05-24: qty 1000

## 2020-05-24 MED ORDER — LABETALOL HCL 5 MG/ML IV SOLN
80.0000 mg | INTRAVENOUS | Status: DC | PRN
  Administered 2020-05-24: 80 mg via INTRAVENOUS
  Filled 2020-05-24: qty 16

## 2020-05-24 MED ORDER — MAGNESIUM SULFATE BOLUS VIA INFUSION
4.0000 g | Freq: Once | INTRAVENOUS | Status: AC
  Administered 2020-05-24: 4 g via INTRAVENOUS
  Filled 2020-05-24: qty 1000

## 2020-05-24 MED ORDER — CYCLOBENZAPRINE HCL 5 MG PO TABS
10.0000 mg | ORAL_TABLET | Freq: Once | ORAL | Status: AC
  Administered 2020-05-24: 10 mg via ORAL
  Filled 2020-05-24: qty 2

## 2020-05-24 MED ORDER — HYDRALAZINE HCL 20 MG/ML IJ SOLN: 10.0000 mg | INTRAMUSCULAR | Status: DC | PRN

## 2020-05-24 NOTE — H&P (Signed)
34 y.o. [id number] @ [redacted]w[redacted]d presents with elevated blood pressure.  She has a history of preeclampsia with G1, but denies history of chronic hypertension.  She was noted at 22 weeks to have new onset 2+ proteinuria.  Short interval follow up at 24 weeks on 5/28 revealed new onset hypertension with BPs 130-142/90.  Labs were normal with the exception of an elevated urine protein: creatinine ratio of  She was seen in the office yesterday again with BPs of 148/90s.  She has been monitoring her BPs at home and today noted a BP of 167/102.  She presented to MAU for evaluation. She had multiple severe range BPs.  The anti-hypertensive protocol was activated.  She received 3 doses of labetalol (20, 40, 80mg ) and now BPs are 140-150s/80-90s.  She denies headache, visual changes, epigastric or abdominal pain.  Endorsed mild shoulder pain that resolved with flexeril.     Pregnancy c/b:  1. History of preeclampsia--with G1, delivered at 37 weeks.  No HTN with G2/G3--both delivered at 40 weeks.  Started low dose aspirin at 22 weeks this pregnancy 2. Advanced maternal age--NIPT low risk  Past Medical History:  Diagnosis Date  . Anemia     Past Surgical History:  Procedure Laterality Date  . NO PAST SURGERIES      OB History  Gravida Para Term Preterm AB Living  6 3 3   2 3   SAB TAB Ectopic Multiple Live Births          3    # Outcome Date GA Lbr Len/2nd Weight Sex Delivery Anes PTL Lv  6 Current           5 Term 01/20/10 [redacted]w[redacted]d   F Vag-Spont   LIV  4 Term 01/27/07 [redacted]w[redacted]d   M Vag-Spont   LIV  3 Term 11/23/03 [redacted]w[redacted]d   F Vag-Spont   LIV  2 AB           1 AB               Social History   Socioeconomic History  . Marital status: Married    Spouse name: Not on file  . Number of children: Not on file  . Years of education: Not on file  . Highest education level: Not on file  Occupational History  . Not on file  Tobacco Use  . Smoking status: Never Smoker  . Smokeless tobacco: Never Used  Substance  and Sexual Activity  . Alcohol use: Not Currently  . Drug use: Never  . Sexual activity: Yes  Other Topics Concern  . Not on file  Social History Narrative  . Not on file   Social Determinants of Health     Patient has no known allergies.     ABO, Rh: --/--/O POS (03/07 2140) Antibody:  Negative Rubella:  Immune RPR:   NR HBsAg:   Negative HIV:   negative GBS:   Unknown    Vitals:   05/24/20 1910 05/24/20 1919  BP: (!) 145/98 (!) 151/94  Pulse: 84 80  Resp:  17  Temp:    SpO2: 96% 96%     General:  NAD HEENT:  No facial edema Abdomen:  soft, gravid Ex:  No lower extremity edema SVE:  deffered FHTs:  150s, moderate variability, appropriate for gestational age Toco:  quiet   A/P   34 y.o. [id number] [redacted]w[redacted]d presents with new onset severe preeclampsia NICU is closed to admission.  [facility] contacted and Dr. Lebron Conners with  MFM accepts the transfer  Prior to transfer, she received BMZ dose #1 (1820) and magnesium sulfate 4gm bolus (1840)      Hong Moring GEFFEL [employer]

## 2020-05-24 NOTE — MAU Provider Note (Signed)
Chief Complaint:  Hypertension and Shoulder Pain   First Provider Initiated Contact with Patient 05/24/20 1509      HPI: Joann Thompson is a 34 y.o. [id number] at [redacted]w[redacted]d who presents to maternity admissions sent from her office for elevated BP at home. She reports elevated BP in the office 2 weeks ago, then again yesterday in the office. She started taking BPs with a home cuff and called her office today with severe range BPs of 160s/100s.  She denies h/a, epigastric pain or visual disturbances. She does have left shoulder pain that is constant when at rest, improves with movement. She has not tried any treatments.  She reports good fetal movement.  She has hx preeclampsia with one previous pregnancy.  Location: left shoulder Quality: sharp Severity: 6/10 on pain scale Duration:lasting minutes to hours, varies Timing: intermittent Modifying factors: movement makes pain better, worse at rest Associated signs and symptoms: none  HPI  Past Medical History: Past Medical History:  Diagnosis Date  . Anemia     Past obstetric history: OB History  Gravida Para Term Preterm AB Living  6 3 3   2 3   SAB TAB Ectopic Multiple Live Births          3    # Outcome Date GA Lbr Len/2nd Weight Sex Delivery Anes PTL Lv  6 Current           5 Term 01/20/10 [redacted]w[redacted]d   F Vag-Spont   LIV  4 Term 01/27/07 [redacted]w[redacted]d   M Vag-Spont   LIV  3 Term 11/23/03 105w0d   F Vag-Spont   LIV  2 AB           1 AB             Past Surgical History: Past Surgical History:  Procedure Laterality Date  . NO PAST SURGERIES      Family History: No family history on file.  Social History: Social History   Tobacco Use  . Smoking status: Never Smoker  . Smokeless tobacco: Never Used  Substance Use Topics  . Alcohol use: Not Currently  . Drug use: Never    Allergies: No Known Allergies  Meds:  Medications Prior to Admission  Medication Sig Dispense Refill Last Dose  . aspirin EC 81 MG tablet Take 81 mg by mouth  daily.     . ferrous sulfate 325 (65 FE) MG tablet Take 325 mg by mouth daily with breakfast.     . Prenatal Vit-Fe Fumarate-FA (PRENATAL MULTIVITAMIN) TABS tablet Take 1 tablet by mouth daily at 12 noon.   05/24/2020 at Unknown time  . ondansetron (ZOFRAN ODT) 4 MG disintegrating tablet Take 1 tablet (4 mg total) by mouth every 6 (six) hours as needed for nausea. 20 tablet 0   . potassium chloride SA (KLOR-CON) 20 MEQ tablet Take 1 tablet (20 mEq total) by mouth daily. 4 tablet 0   . promethazine (PHENERGAN) 12.5 MG tablet Take by mouth every 6 (six) hours as needed for nausea or vomiting.       ROS:  Review of Systems  Constitutional: Negative for chills, fatigue and fever.  Eyes: Negative for visual disturbance.  Respiratory: Negative for shortness of breath.   Cardiovascular: Negative for chest pain.  Gastrointestinal: Negative for abdominal pain, nausea and vomiting.  Genitourinary: Negative for difficulty urinating, dysuria, flank pain, pelvic pain, vaginal bleeding, vaginal discharge and vaginal pain.  Musculoskeletal: Positive for myalgias.  Neurological: Negative for dizziness and headaches.  Psychiatric/Behavioral:  Negative.      I have reviewed patient's Past Medical Hx, Surgical Hx, Family Hx, Social Hx, medications and allergies.   Physical Exam   Patient Vitals for the past 24 hrs:  BP Temp Temp src Pulse Resp SpO2 Height Weight  05/24/20 1856 -- 99.3 F (37.4 C) Oral -- 16 99 % -- --  05/24/20 1851 (!) 157/94 -- -- 90 -- -- -- --  05/24/20 1841 (!) 148/90 -- -- 78 -- -- -- --  05/24/20 1831 (!) 152/99 -- -- 79 -- -- -- --  05/24/20 1811 (!) 149/98 -- -- 78 -- -- -- --  05/24/20 1801 (!) 148/92 -- -- 90 -- -- -- --  05/24/20 1751 (!) 150/94 -- -- 77 -- -- -- --  05/24/20 1741 (!) 145/100 -- -- 82 -- -- -- --  05/24/20 1731 (!) 152/101 -- -- 78 -- -- -- --  05/24/20 1721 (!) 150/93 -- -- 79 -- -- -- --  05/24/20 1711 (!) 146/98 -- -- 80 -- -- -- --  05/24/20 1701  (!) 148/97 -- -- 81 -- -- -- --  05/24/20 1651 (!) 142/99 -- -- 83 -- -- -- --  05/24/20 1630 (!) 170/101 -- -- 87 -- 98 % -- --  05/24/20 1620 (!) 159/101 -- -- 77 -- 97 % -- --  05/24/20 1611 (!) 159/101 -- -- 78 -- 97 % -- --  05/24/20 1600 (!) 155/103 -- -- 84 -- 98 % -- --  05/24/20 1551 (!) 146/99 -- -- 83 -- 96 % -- --  05/24/20 1541 (!) 152/100 -- -- 82 -- -- -- --  05/24/20 1530 (!) 165/109 -- -- 89 -- 97 % -- --  05/24/20 1520 (!) 158/101 -- -- 85 -- 97 % -- --  05/24/20 1511 (!) 153/102 -- -- 84 -- -- -- --  05/24/20 1500 (!) 166/101 -- -- 84 -- 98 % -- --  05/24/20 1456 (!) 158/104 -- -- 83 -- -- -- --  05/24/20 1432 (!) 160/106 99 F (37.2 C) -- 100 18 -- 4\' 11"  (1.499 m) 63.5 kg   Constitutional: Well-developed, well-nourished female in no acute distress.  Cardiovascular: normal rate Respiratory: normal effort GI: Abd soft, non-tender, gravid appropriate for gestational age.  MS: Extremities nontender, no edema, normal ROM Neurologic: Alert and oriented x 4.  GU: Neg CVAT.  PELVIC EXAM: Deferred     FHT:  Baseline 145 , moderate variability, 10 x 10 accelerations present, no decelerations.  NST reactive/appropriate for gestational age. Contractions: some intermittent irritability   Labs: Results for orders placed or performed during the hospital encounter of 05/24/20 (from the past 24 hour(s))  Urinalysis, Routine w reflex microscopic     Status: Abnormal   Collection Time: 05/24/20  2:44 PM  Result Value Ref Range   Color, Urine YELLOW YELLOW   APPearance HAZY (A) CLEAR   Specific Gravity, Urine 1.018 1.005 - 1.030   pH 6.0 5.0 - 8.0   Glucose, UA 50 (A) NEGATIVE mg/dL   Hgb urine dipstick NEGATIVE NEGATIVE   Bilirubin Urine NEGATIVE NEGATIVE   Ketones, ur NEGATIVE NEGATIVE mg/dL   Protein, ur 07/24/20 (A) NEGATIVE mg/dL   Nitrite NEGATIVE NEGATIVE   Leukocytes,Ua NEGATIVE NEGATIVE   RBC / HPF 0-5 0 - 5 RBC/hpf   WBC, UA 0-5 0 - 5 WBC/hpf   Bacteria, UA  RARE (A) NONE SEEN   Squamous Epithelial / LPF 6-10 0 - 5   Mucus  PRESENT    Hyaline Casts, UA PRESENT    Granular Casts, UA PRESENT    Amorphous Crystal PRESENT   Protein / creatinine ratio, urine     Status: Abnormal   Collection Time: 05/24/20  2:44 PM  Result Value Ref Range   Creatinine, Urine 114.56 mg/dL   Total Protein, Urine 1,130 mg/dL   Protein Creatinine Ratio 9.86 (H) 0.00 - 0.15 mg/mg[Cre]  CBC     Status: Abnormal   Collection Time: 05/24/20  3:04 PM  Result Value Ref Range   WBC 9.4 4.0 - 10.5 K/uL   RBC 4.14 3.87 - 5.11 MIL/uL   Hemoglobin 9.7 (L) 12.0 - 15.0 g/dL   HCT 30.4 (L) 36.0 - 46.0 %   MCV 73.4 (L) 80.0 - 100.0 fL   MCH 23.4 (L) 26.0 - 34.0 pg   MCHC 31.9 30.0 - 36.0 g/dL   RDW 19.5 (H) 11.5 - 15.5 %   Platelets 280 150 - 400 K/uL   nRBC 0.4 (H) 0.0 - 0.2 %  Comprehensive metabolic panel     Status: Abnormal   Collection Time: 05/24/20  3:04 PM  Result Value Ref Range   Sodium 137 135 - 145 mmol/L   Potassium 3.9 3.5 - 5.1 mmol/L   Chloride 106 98 - 111 mmol/L   CO2 20 (L) 22 - 32 mmol/L   Glucose, Bld 90 70 - 99 mg/dL   BUN 12 6 - 20 mg/dL   Creatinine, Ser 0.92 0.44 - 1.00 mg/dL   Calcium 8.3 (L) 8.9 - 10.3 mg/dL   Total Protein 5.7 (L) 6.5 - 8.1 g/dL   Albumin 2.1 (L) 3.5 - 5.0 g/dL   AST 20 15 - 41 U/L   ALT 17 0 - 44 U/L   Alkaline Phosphatase 106 38 - 126 U/L   Total Bilirubin 0.9 0.3 - 1.2 mg/dL   GFR calc non Af Amer >60 >60 mL/min   GFR calc Af Amer >60 >60 mL/min   Anion gap 11 5 - 15   --/--/O POS (03/07 2140)  Imaging:  No results found.  MAU Course/MDM: Orders Placed This Encounter  Procedures  . SARS Coronavirus 2 by RT PCR (hospital order, performed in [facility] hospital lab) Nasopharyngeal Nasopharyngeal Swab  . Urinalysis, Routine w reflex microscopic  . Protein / creatinine ratio, urine  . CBC  . Comprehensive metabolic panel  . Notify Physician  . Measure blood pressure    Meds ordered this encounter   Medications  . labetalol (NORMODYNE) 5 MG/ML injection    Druebbisch, Elmyra Ricks  : cabinet override  . AND Linked Order Group   . labetalol (NORMODYNE) injection 20 mg   . labetalol (NORMODYNE) injection 40 mg   . labetalol (NORMODYNE) injection 80 mg   . hydrALAZINE (APRESOLINE) injection 10 mg  . cyclobenzaprine (FLEXERIL) tablet 10 mg  . betamethasone acetate-betamethasone sodium phosphate (CELESTONE) injection 12 mg  . magnesium bolus via infusion 4 g  . magnesium sulfate 40 grams in SWI 1000 mL OB infusion  . lactated ringers infusion     NST reviewed and reactive or appropriate for gestational age BP severe range here in MAU, no s/sx of PEC Pt with shoulder pain improved with Flexeril Labetalol x 3 doses given per protocol with improved BP, remain elevated but no longer severe range Consult Dr Elly Modena with presentation, exam findings and test results.  Recommend admission for observation, BP management, and Betamethasone in anticipation of early delivery Called Dr Carlis Abbott  who presents to MAU to see and admit the patient NICU with no beds so pt to transfer to another facility, Dr Chestine Spore to call transfer center for bed    Assessment: 1. Preeclampsia, severe, second trimester     Plan: Admit Magnesium sulfate 4g bolus/2g/hour Betamethasone x 1 dose given Pt to transfer to facility with available NICU services  Sharen Counter Certified Nurse-Midwife 05/24/2020 7:01 PM

## 2020-05-24 NOTE — MAU Note (Signed)
Pt stated they have been monitoring her b/p a because it has been high. It was 160/102 at home.  Denies any headache or visual changes.Pt also c/o left shoulder blade pain x 3 days. Pain is sharp and hurts at rest. Good fetal movement reported.

## 2020-05-26 MED ORDER — SODIUM CHLORIDE FLUSH 0.9 % IV SOLN
10.00 | INTRAVENOUS | Status: DC
Start: 2020-05-29 — End: 2020-05-26

## 2020-05-26 MED ORDER — FERROUS SULFATE 325 (65 FE) MG PO TABS
325.00 | ORAL_TABLET | ORAL | Status: DC
Start: 2020-05-30 — End: 2020-05-26

## 2020-05-26 MED ORDER — PRENATAL 28-0.8 MG PO TABS
1.00 | ORAL_TABLET | ORAL | Status: DC
Start: 2020-05-30 — End: 2020-05-26

## 2020-05-26 MED ORDER — ACETAMINOPHEN 325 MG PO TABS
650.00 | ORAL_TABLET | ORAL | Status: DC
Start: ? — End: 2020-05-26

## 2020-05-26 MED ORDER — NIFEDIPINE ER OSMOTIC RELEASE 30 MG PO TB24
30.00 | ORAL_TABLET | ORAL | Status: DC
Start: 2020-05-26 — End: 2020-05-26

## 2020-05-26 MED ORDER — LACTATED RINGERS IV SOLN
INTRAVENOUS | Status: DC
Start: ? — End: 2020-05-26

## 2020-05-26 MED ORDER — SODIUM CHLORIDE FLUSH 0.9 % IV SOLN
10.00 | INTRAVENOUS | Status: DC
Start: ? — End: 2020-05-26

## 2020-05-26 MED ORDER — GENERIC EXTERNAL MEDICATION
Status: DC
Start: ? — End: 2020-05-26

## 2020-05-26 MED ORDER — FAMOTIDINE 20 MG PO TABS
20.00 | ORAL_TABLET | ORAL | Status: DC
Start: ? — End: 2020-05-26

## 2020-05-26 MED ORDER — LACTATED RINGERS IV SOLN
500.00 | INTRAVENOUS | Status: DC
Start: ? — End: 2020-05-26

## 2020-05-29 MED ORDER — GENERIC EXTERNAL MEDICATION
Status: DC
Start: ? — End: 2020-05-29

## 2020-05-29 MED ORDER — SIMETHICONE 80 MG PO CHEW
80.00 | CHEWABLE_TABLET | ORAL | Status: DC
Start: ? — End: 2020-05-29

## 2020-05-29 MED ORDER — NIFEDIPINE ER OSMOTIC RELEASE 30 MG PO TB24
60.00 | ORAL_TABLET | ORAL | Status: DC
Start: 2020-05-29 — End: 2020-05-29

## 2020-05-29 MED ORDER — NIFEDIPINE ER OSMOTIC RELEASE 30 MG PO TB24
60.00 | ORAL_TABLET | ORAL | Status: DC
Start: 2020-05-30 — End: 2020-05-29

## 2021-01-06 IMAGING — US US ABDOMEN LIMITED
1 series · 15 of 25 positions shown · non-contrast
Comparison: None.

CLINICAL DATA: Abnormal LFTs with abdominal pain, 8 weeks pregnant

EXAM:
ULTRASOUND ABDOMEN LIMITED RIGHT UPPER QUADRANT

[Series 1: us abdomen limited · 15 of 54 slices shown]
[im 1/54]
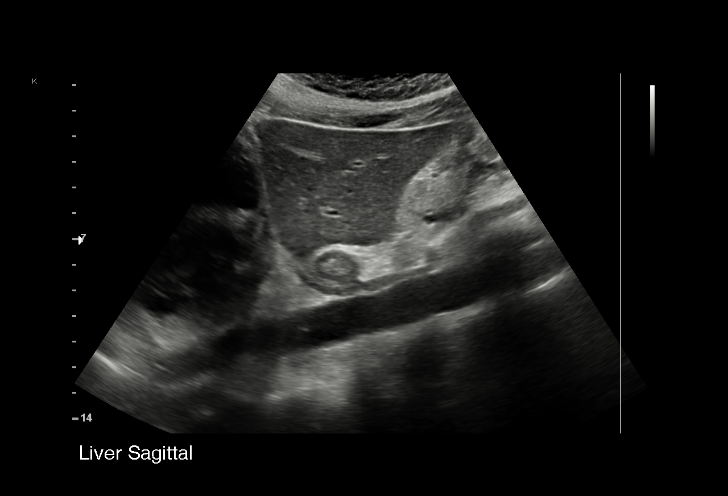
[im 5/54]
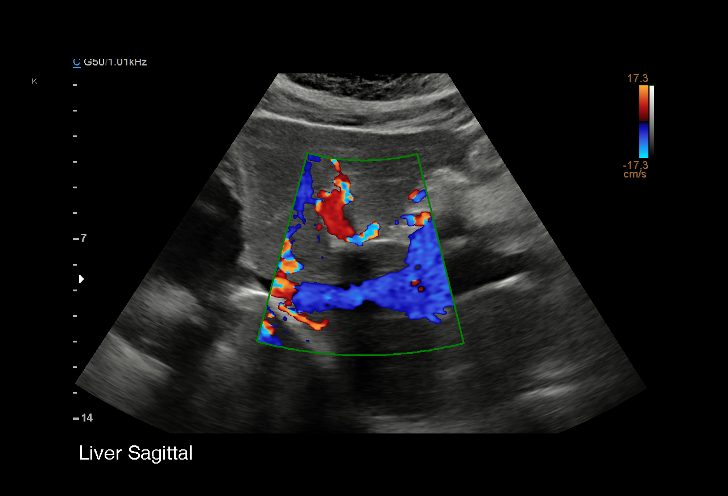
[im 9/54]
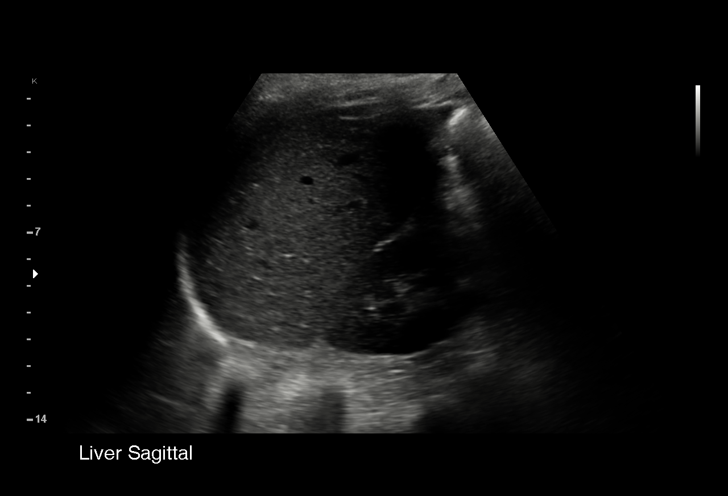
[im 12/54]
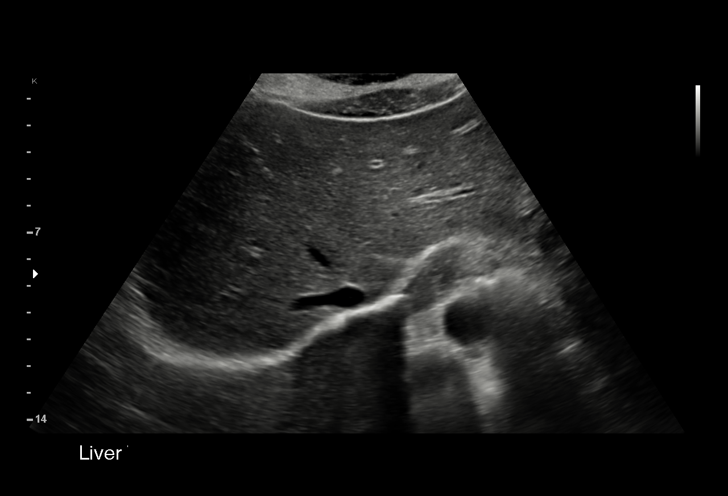
[im 16/54]
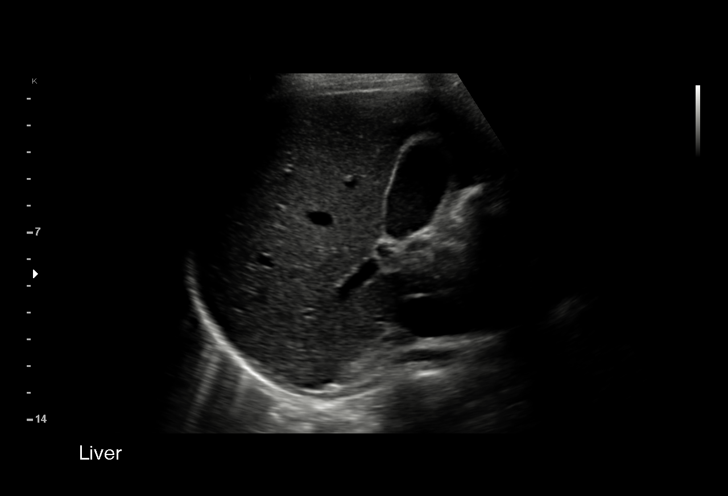
[im 20/54]
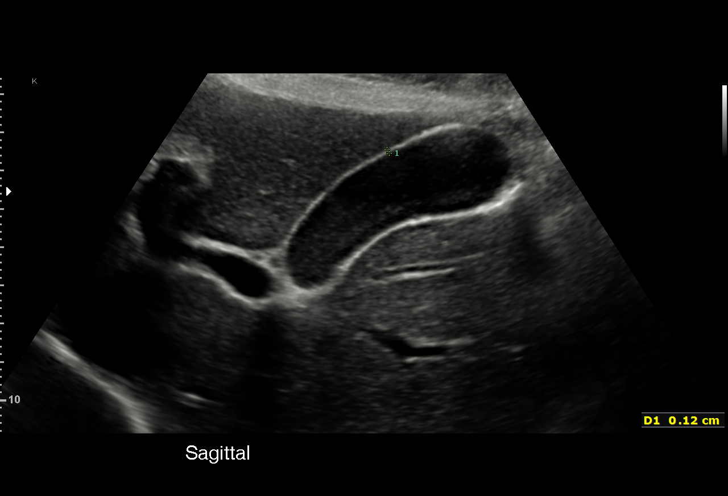
[im 23/54]
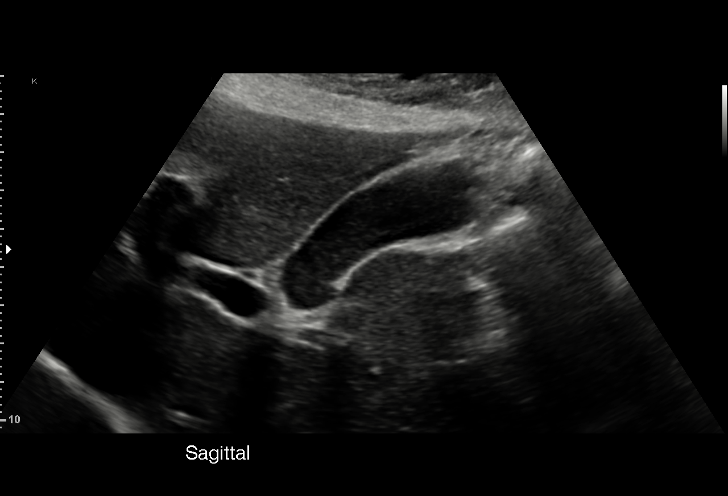
[im 27/54]
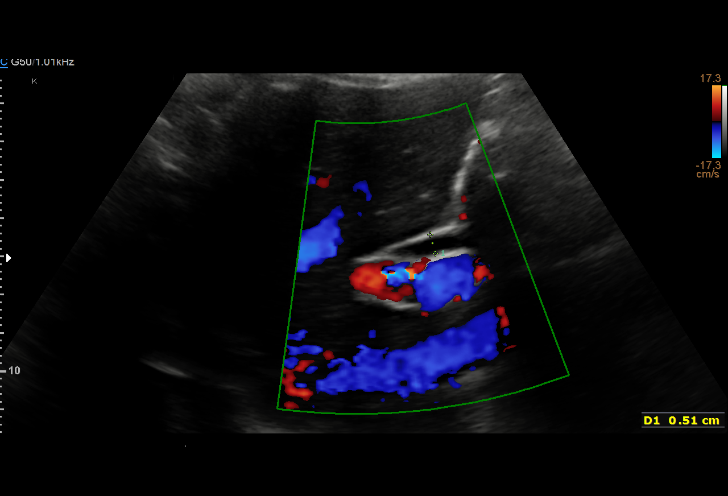
[im 31/54]
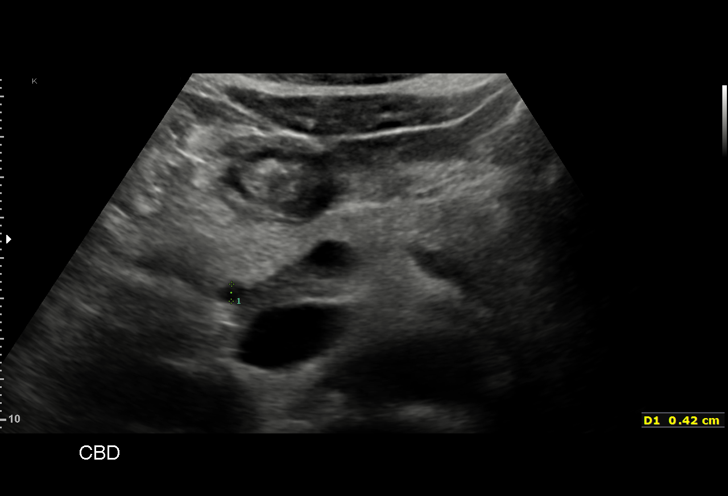
[im 34/54]
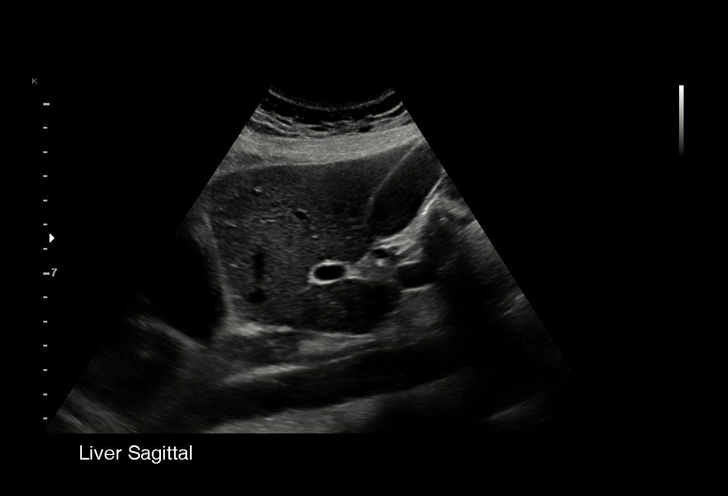
[im 38/54]
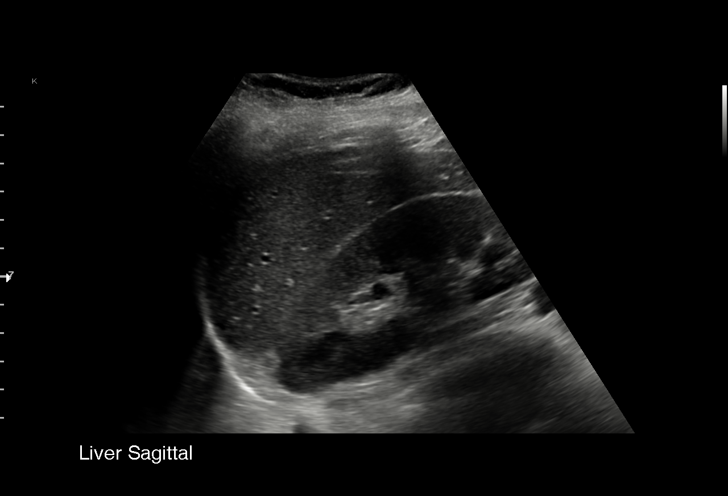
[im 42/54]
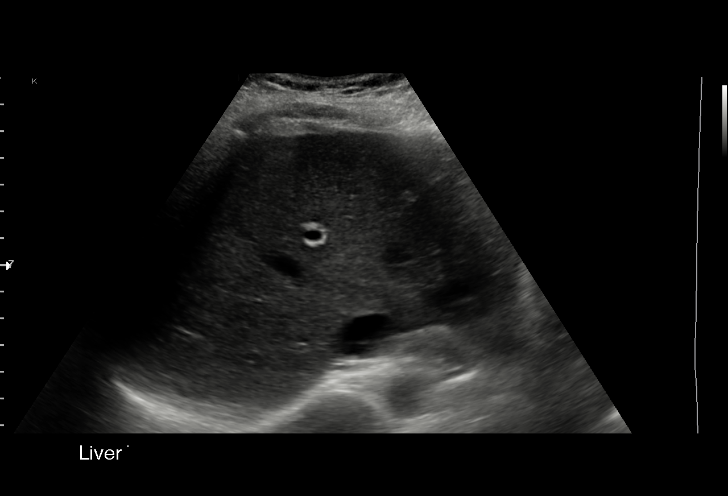
[im 45/54]
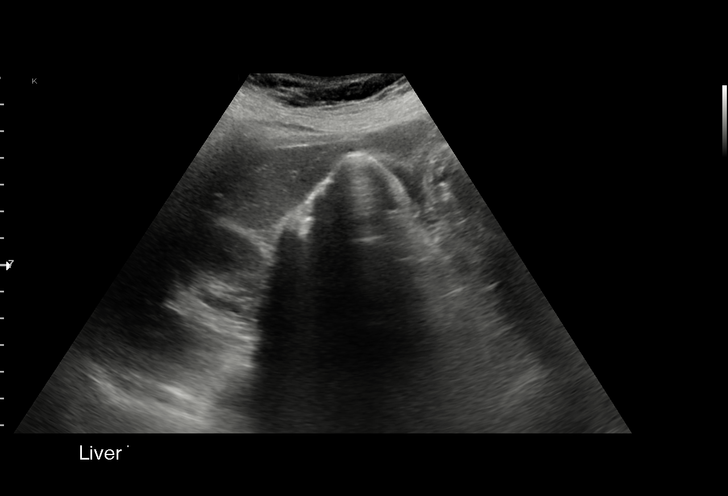
[im 49/54]
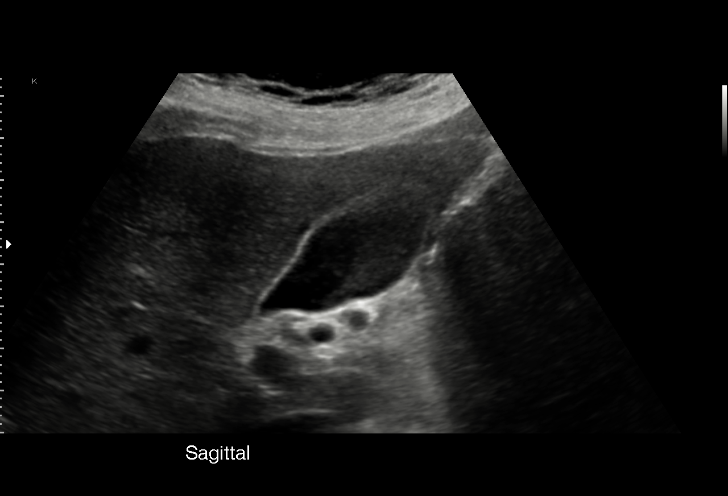
[im 54/54]
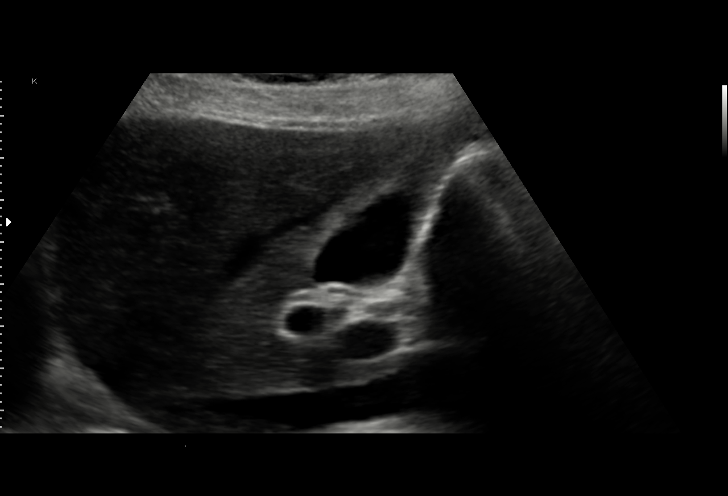

[15 of 25 positions shown; findings below may reference images not displayed]

FINDINGS: Gallbladder:

Well distended without evidence of cholelithiasis. No wall
thickening is seen. Mild gallbladder sludge is noted.

Common bile duct:

Diameter: 5 mm

Liver:

No focal lesion identified. Within normal limits in parenchymal
echogenicity. Portal vein is patent on color Doppler imaging with
normal direction of blood flow towards the liver.

Other: None.
IMPRESSION: Gallbladder sludge without complicating factors.

## 2022-02-23 ENCOUNTER — Emergency Department (HOSPITAL_BASED_OUTPATIENT_CLINIC_OR_DEPARTMENT_OTHER)
Admission: EM | Admit: 2022-02-23 | Discharge: 2022-02-23 | Disposition: A | Discharge status: Home / Self Care | Payer: Commercial Managed Care - PPO | Attending: Emergency Medicine | Admitting: Emergency Medicine

## 2022-02-23 ENCOUNTER — Other Ambulatory Visit: Payer: Self-pay

## 2022-02-23 ENCOUNTER — Encounter (HOSPITAL_BASED_OUTPATIENT_CLINIC_OR_DEPARTMENT_OTHER): Payer: Self-pay | Admitting: *Deleted

## 2022-02-23 DIAGNOSIS — Z7982 Long term (current) use of aspirin: Secondary | ICD-10-CM | POA: Diagnosis not present

## 2022-02-23 DIAGNOSIS — W4904XA Ring or other jewelry causing external constriction, initial encounter: Secondary | ICD-10-CM | POA: Insufficient documentation

## 2022-02-23 DIAGNOSIS — R2232 Localized swelling, mass and lump, left upper limb: Secondary | ICD-10-CM | POA: Insufficient documentation

## 2022-02-23 DIAGNOSIS — M79642 Pain in left hand: Secondary | ICD-10-CM | POA: Insufficient documentation

## 2022-02-23 NOTE — ED Triage Notes (Signed)
Pt has 2 rings on left ring finger and has not been able to get it back off.  Pt states that she has attempted to get it off at home many times without success causing her finger to swell.  Cap refill under 3 seconds, no distress.  ?

## 2022-02-23 NOTE — ED Provider Notes (Signed)
? ?[facility] ?Provider Note: Joann Dell, MD, FACEP ? ?CSN: [id number] ?MRN: [id number] ?ARRIVAL: 02/23/22 at 2311 ?ROOM: [id number] ? ? ?CHIEF COMPLAINT  ?Hand Pain (Ring stuck) ? ? ?HISTORY OF PRESENT ILLNESS  ?02/23/22 11:35 PM ?Joann Thompson is a 36 y.o. female whose wedding and engagement ring have been getting tighter and tighter on her left ring finger.  She put them on this morning and throughout the day her fingers became swollen and she is now unable to get the rings off.  She has tried lubrication and other techniques seen on the Internet without relief.  The ring finger is erythematous and swollen distal to the rings.  She rates associated pain as a 5 out of 10. ? ? ?Past Medical History:  ?Diagnosis Date  ? Anemia   ? ? ?Past Surgical History:  ?Procedure Laterality Date  ? NO PAST SURGERIES    ? ? ?No family history on file. ? ?Social History  ? ?Tobacco Use  ? Smoking status: Never  ? Smokeless tobacco: Never  ?Vaping Use  ? Vaping Use: Never used  ?Substance Use Topics  ? Alcohol use: Not Currently  ? Drug use: Never  ? ? ?Prior to Admission medications   ?Medication Sig Start Date End Date Taking? Authorizing Provider  ?aspirin EC 81 MG tablet Take 81 mg by mouth daily.    [provider]  ?ferrous sulfate 325 (65 FE) MG tablet Take 325 mg by mouth daily with breakfast.    [provider]  ?ondansetron (ZOFRAN ODT) 4 MG disintegrating tablet Take 1 tablet (4 mg total) by mouth every 6 (six) hours as needed for nausea. 01/28/20   Aviva Signs, CNM  ?potassium chloride SA (KLOR-CON) 20 MEQ tablet Take 1 tablet (20 mEq total) by mouth daily. 01/28/20   Aviva Signs, CNM  ?Prenatal Vit-Fe Fumarate-FA (PRENATAL MULTIVITAMIN) TABS tablet Take 1 tablet by mouth daily at 12 noon.    [provider]  ?promethazine (PHENERGAN) 12.5 MG tablet Take by mouth every 6 (six) hours as needed for nausea or vomiting.    [provider]  ? ? ?Allergies ?Patient has  no known allergies. ? ? ?REVIEW OF SYSTEMS  ?Negative except as noted here or in the History of Present Illness. ? ? ?PHYSICAL EXAMINATION  ?Initial Vital Signs ?Blood pressure (!) 149/100, pulse 99, temperature 98.7 ?F (37.1 ?C), temperature source Oral, resp. rate 16, height 4\' 11"  (1.499 m), weight 63.5 kg, last menstrual period 02/23/2022, SpO2 100 %, unknown if currently breastfeeding. ? ?Examination ?General: Well-developed, well-nourished female in no acute distress; appearance consistent with age of record ?HENT: normocephalic; atraumatic ?Eyes: Normal appearance ?Neck: supple ?Heart: regular rate and rhythm ?Lungs: clear to auscultation bilaterally ?Abdomen: soft; nondistended; nontender; bowel sounds present ?Extremities: Wedding engagement rings on left ring finger with erythema and swelling of the finger distally, finger remains neurovascularly intact with intact tendon function ?Neurologic: Awake, alert and oriented; motor function intact in all extremities and symmetric; no facial droop ?Skin: Warm and dry ?Psychiatric: Normal mood and affect ? ? ?RESULTS  ?Summary of this visit's results, reviewed and interpreted by myself: ? ? EKG Interpretation ? ?Date/Time:    ?Ventricular Rate:    ?PR Interval:    ?QRS Duration:   ?QT Interval:    ?QTC Calculation:   ?R Axis:     ?Text Interpretation:   ?  ? ?  ? ?Laboratory Studies: ?No results found for this or any previous visit (from  the past 24 hour(s)). ?Imaging Studies: ?No results found. ? ?ED COURSE and MDM  ?Nursing notes, initial and subsequent vitals signs, including pulse oximetry, reviewed and interpreted by myself. ? ?Vitals:  ? 02/23/22 2320 02/23/22 2323  ?BP: (!) 149/100   ?Pulse: 99   ?Resp: 16   ?Temp: 98.7 ?F (37.1 ?C)   ?TempSrc: Oral   ?SpO2: 100%   ?Weight:  63.5 kg  ?Height:  4\' 11"  (1.499 m)  ? ?Medications - No data to display ? ? ? ?PROCEDURES  ?Procedures ? ?RING REMOVAL ?After informed verbal consent was obtained the patient's rings  were cut using an electric diamond disc ring cutter.  The rings were then spread in diameter using hemostats and removed from the finger after applying surgical lube.  The patient tolerated this well and there were no immediate complications. ? ?ED DIAGNOSES  ? ?  ICD-10-CM   ?1. Ring or other jewelry causing external constriction, initial encounter  W49.04XA   ?  ? ? ? ?  ?Makita Blow, MD ?02/23/22 2340 ? ?

## 2023-01-26 ENCOUNTER — Other Ambulatory Visit: Payer: Self-pay

## 2023-01-26 ENCOUNTER — Encounter (HOSPITAL_BASED_OUTPATIENT_CLINIC_OR_DEPARTMENT_OTHER): Payer: Self-pay

## 2023-01-26 ENCOUNTER — Emergency Department (HOSPITAL_BASED_OUTPATIENT_CLINIC_OR_DEPARTMENT_OTHER)
Admission: EM | Admit: 2023-01-26 | Discharge: 2023-01-26 | Disposition: A | Discharge status: Home / Self Care | Payer: Commercial Managed Care - PPO | Attending: Emergency Medicine | Admitting: Emergency Medicine

## 2023-01-26 DIAGNOSIS — Z7982 Long term (current) use of aspirin: Secondary | ICD-10-CM | POA: Insufficient documentation

## 2023-01-26 DIAGNOSIS — J101 Influenza due to other identified influenza virus with other respiratory manifestations: Secondary | ICD-10-CM | POA: Insufficient documentation

## 2023-01-26 DIAGNOSIS — J029 Acute pharyngitis, unspecified: Secondary | ICD-10-CM | POA: Diagnosis present

## 2023-01-26 DIAGNOSIS — Z1152 Encounter for screening for COVID-19: Secondary | ICD-10-CM | POA: Insufficient documentation

## 2023-01-26 LAB — RESP PANEL BY RT-PCR (RSV, FLU A&B, COVID)  RVPGX2
Influenza A by PCR: NEGATIVE
Influenza B by PCR: POSITIVE — AB
Resp Syncytial Virus by PCR: NEGATIVE
SARS Coronavirus 2 by RT PCR: NEGATIVE

## 2023-01-26 MED ORDER — SODIUM CHLORIDE 0.9 % IV BOLUS
1000.0000 mL | Freq: Once | INTRAVENOUS | Status: AC
  Administered 2023-01-26: 1000 mL via INTRAVENOUS

## 2023-01-26 MED ORDER — DEXAMETHASONE SODIUM PHOSPHATE 10 MG/ML IJ SOLN
10.0000 mg | Freq: Once | INTRAMUSCULAR | Status: AC
  Administered 2023-01-26: 10 mg via INTRAVENOUS
  Filled 2023-01-26: qty 1

## 2023-01-26 MED ORDER — METOCLOPRAMIDE HCL 5 MG/ML IJ SOLN
10.0000 mg | Freq: Once | INTRAMUSCULAR | Status: AC
  Administered 2023-01-26: 10 mg via INTRAVENOUS
  Filled 2023-01-26: qty 2

## 2023-01-26 MED ORDER — KETOROLAC TROMETHAMINE 15 MG/ML IJ SOLN
15.0000 mg | Freq: Once | INTRAMUSCULAR | Status: AC
  Administered 2023-01-26: 15 mg via INTRAVENOUS
  Filled 2023-01-26: qty 1

## 2023-01-26 NOTE — ED Provider Notes (Signed)
[facility] Provider Note   CSN: [id number] Arrival date & time: 01/26/23  1153     History  Chief Complaint  Patient presents with   Headache   Chills    Joann Thompson is a 37 y.o. female.   Headache   37 year old female presents emergency department with complaints of headache, body aches, cough, fever, sore throat, generalized weakness.  Patient reports symptoms began this past Friday.  Patient with known exposure to influenza A in the form of a brother who visited this past weekend.  Patient reports headache involving forehead across.  Patient states the headache began gently and is gradually worsened since then.  Has tried over-the-counter medications in the form of ibuprofen and Tylenol which have helped some.  Presents emergency department for evaluation.  Denies chest pain, abdominal pain, vomiting, urinary/vaginal symptoms, change in bowel habits.  Last menstrual period at the beginning of January.  Denies visual disturbance, gait abnormality, weakness/sensory deficits in upper or lower extremities, slurred speech, facial droop.  No significant pertinent past medical history. Home Medications Prior to Admission medications   Medication Sig Start Date End Date Taking? Authorizing Provider  aspirin EC 81 MG tablet Take 81 mg by mouth daily.    [provider]  ferrous sulfate 325 (65 FE) MG tablet Take 325 mg by mouth daily with breakfast.    [provider]  ondansetron (ZOFRAN ODT) 4 MG disintegrating tablet Take 1 tablet (4 mg total) by mouth every 6 (six) hours as needed for nausea. 01/28/20   Seabron Spates, CNM  potassium chloride SA (KLOR-CON) 20 MEQ tablet Take 1 tablet (20 mEq total) by mouth daily. 01/28/20   Seabron Spates, CNM  Prenatal Vit-Fe Fumarate-FA (PRENATAL MULTIVITAMIN) TABS tablet Take 1 tablet by mouth daily at 12 noon.    [provider]  promethazine (PHENERGAN) 12.5 MG tablet  Take by mouth every 6 (six) hours as needed for nausea or vomiting.    [provider]      Allergies    Patient has no known allergies.    Review of Systems   Review of Systems  Neurological:  Positive for headaches.  All other systems reviewed and are negative.   Physical Exam Updated Vital Signs BP 108/67 (BP Location: Right Arm)   Pulse 90   Temp 99 F (37.2 C)   Resp 16   SpO2 96%  Physical Exam Vitals and nursing note reviewed.  Constitutional:      General: She is not in acute distress.    Appearance: She is well-developed.  HENT:     Head: Normocephalic and atraumatic.     Nose: Congestion present.     Mouth/Throat:     Comments: Uvula midline rise symmetrical phonation.  No sublingual or submandibular swelling appreciated.  Tonsils 0-1+ bilaterally. Eyes:     Conjunctiva/sclera: Conjunctivae normal.  Neck:     Comments: Kernig and Brudzinski negative. Cardiovascular:     Rate and Rhythm: Normal rate and regular rhythm.     Heart sounds: No murmur heard. Pulmonary:     Effort: Pulmonary effort is normal. No respiratory distress.     Breath sounds: Normal breath sounds.  Abdominal:     Palpations: Abdomen is soft.     Tenderness: There is no abdominal tenderness.  Musculoskeletal:        General: No swelling.     Cervical back: Neck supple.  Skin:    General: Skin is  warm and dry.     Capillary Refill: Capillary refill takes less than 2 seconds.  Neurological:     Mental Status: She is alert.     Comments: Alert and oriented to self, place, time and event.   Speech is fluent, clear without dysarthria or dysphasia.   Strength 5/5 in upper/lower extremities   Sensation intact in upper/lower extremities   Normal gait.  Negative Romberg. No pronator drift.  Normal finger-to-nose and feet tapping.  CN I not tested  CN II not tested CN III, IV, VI PERRLA and EOMs intact bilaterally  CN V Intact sensation to sharp and light touch to the face   CN VII facial movements symmetric  CN VIII not tested  CN IX, X no uvula deviation, symmetric rise of soft palate  CN XI 5/5 SCM and trapezius strength bilaterally  CN XII Midline tongue protrusion, symmetric L/R movements     Psychiatric:        Mood and Affect: Mood normal.     ED Results / Procedures / Treatments   Labs (all labs ordered are listed, but only abnormal results are displayed) Labs Reviewed  RESP PANEL BY RT-PCR (RSV, FLU A&B, COVID)  RVPGX2 - Abnormal; Notable for the following components:      Result Value   Influenza B by PCR POSITIVE (*)    All other components within normal limits    EKG None  Radiology No results found.  Procedures Procedures    Medications Ordered in ED Medications  sodium chloride 0.9 % bolus 1,000 mL (0 mLs Intravenous Stopped 01/26/23 1430)  metoCLOPramide (REGLAN) injection 10 mg (10 mg Intravenous Given 01/26/23 1309)  ketorolac (TORADOL) 15 MG/ML injection 15 mg (15 mg Intravenous Given 01/26/23 1307)  dexamethasone (DECADRON) injection 10 mg (10 mg Intravenous Given 01/26/23 1308)    ED Course/ Medical Decision Making/ A&P                             Medical Decision Making Risk Prescription drug management.   This patient presents to the ED for concern of flulike illness, this involves an extensive number of treatment options, and is a complaint that carries with it a high risk of complications and morbidity.  The differential diagnosis includes influenza, COVID, RSV, pneumonia, meningitis, sepsis   Co morbidities that complicate the patient evaluation  See HPI   Additional history obtained:  Additional history obtained from EMR External records from outside source obtained and reviewed including hospital records   Lab Tests:  I Ordered, and personally interpreted labs.  The pertinent results include: Respiratory viral panel positive for influenza B   Imaging Studies ordered:  N/a   Cardiac Monitoring:  / EKG:  The patient was maintained on a cardiac monitor.  I personally viewed and interpreted the cardiac monitored which showed an underlying rhythm of: Sinus rhythm   Consultations Obtained:  N/a   Problem List / ED Course / Critical interventions / Medication management  Influenza B I ordered medication including Decadron, Toradol, Reglan, 1 L normal saline for migraine cocktail   Reevaluation of the patient after these medicines showed that the patient improved I have reviewed the patients home medicines and have made adjustments as needed   Social Determinants of Health:  Denies tobacco, licit drug use   Test / Admission - Considered:  Influenza B Vitals signs within normal range and stable throughout visit. Laboratory studies significant for: See  above Patient symptoms most likely secondary to influenza B.  Patient noted resolution of headache with administration of migraine cocktail.  Low suspicion for i CVA, cerebral venous thrombosis, meningitis, encephalitis.  Recommended symptomatic therapy at home with Tylenol/Motrin as needed for pain/fever, daily antihistamine, nasal steroid spray.  Recommend follow-up with primary care for reassessment of symptoms.  Patient outside of window for administration of Tamiflu.  Treatment plan discussed at length with patient and she acknowledged understanding was agreeable to said plan. Worrisome signs and symptoms were discussed with the patient, and the patient acknowledged understanding to return to the ED if noticed. Patient was stable upon discharge.          Final Clinical Impression(s) / ED Diagnoses Final diagnoses:  Influenza B    Rx / DC Orders ED Discharge Orders     None         Wilnette Kales, [place] 37/04/88 8916    Lianne Cure, DO 94/50/38 1314

## 2023-01-26 NOTE — ED Triage Notes (Signed)
Pt presents POV with family, ambulatory   Pt c/o ongoing headache, body aches, fever, chills, cough and decrease appetite since Friday. Pt reports taking OTC meds w/no relief  Pt's brother recently tested positive for the flu

## 2023-01-26 NOTE — ED Notes (Signed)
Discharge paperwork given and verbally understood. 

## 2023-01-26 NOTE — Discharge Instructions (Addendum)
Note the visit emergency department today was overall consistent with influenza B infection.  As discussed, recommend symptomatic therapy at home with Tylenol/Motrin as needed for body aches/headache/joint pain.  Recommend daily antihistamine or allergy medicine in the form of Zyrtec/Flonase/Allegra as well as nasal steroid spray in the form of Nasacort/Flonase as needed for nasal congestion.  Recommend reevaluation by primary care for reassessment of symptoms.  Please do not hesitate to return to emergency department for worrisome signs and symptoms we discussed become apparent.

## 2023-10-13 ENCOUNTER — Other Ambulatory Visit: Payer: Self-pay

## 2023-10-13 ENCOUNTER — Emergency Department (HOSPITAL_BASED_OUTPATIENT_CLINIC_OR_DEPARTMENT_OTHER)
Admission: EM | Admit: 2023-10-13 | Discharge: 2023-10-13 | Disposition: A | Discharge status: Home / Self Care | Payer: Commercial Managed Care - PPO | Attending: Emergency Medicine | Admitting: Emergency Medicine

## 2023-10-13 ENCOUNTER — Emergency Department (HOSPITAL_BASED_OUTPATIENT_CLINIC_OR_DEPARTMENT_OTHER): Payer: Commercial Managed Care - PPO | Admitting: Radiology

## 2023-10-13 ENCOUNTER — Encounter (HOSPITAL_BASED_OUTPATIENT_CLINIC_OR_DEPARTMENT_OTHER): Payer: Self-pay | Admitting: Urology

## 2023-10-13 DIAGNOSIS — Z23 Encounter for immunization: Secondary | ICD-10-CM | POA: Insufficient documentation

## 2023-10-13 DIAGNOSIS — W260XXA Contact with knife, initial encounter: Secondary | ICD-10-CM | POA: Insufficient documentation

## 2023-10-13 DIAGNOSIS — Z7982 Long term (current) use of aspirin: Secondary | ICD-10-CM | POA: Diagnosis not present

## 2023-10-13 DIAGNOSIS — S61012A Laceration without foreign body of left thumb without damage to nail, initial encounter: Secondary | ICD-10-CM | POA: Insufficient documentation

## 2023-10-13 MED ORDER — LIDOCAINE-EPINEPHRINE-TETRACAINE (LET) TOPICAL GEL
3.0000 mL | Freq: Once | TOPICAL | Status: AC
  Administered 2023-10-13: 3 mL via TOPICAL
  Filled 2023-10-13: qty 3

## 2023-10-13 MED ORDER — TETANUS-DIPHTH-ACELL PERTUSSIS 5-2.5-18.5 LF-MCG/0.5 IM SUSY
0.5000 mL | PREFILLED_SYRINGE | Freq: Once | INTRAMUSCULAR | Status: AC
  Administered 2023-10-13: 0.5 mL via INTRAMUSCULAR
  Filled 2023-10-13: qty 0.5

## 2023-10-13 NOTE — Discharge Instructions (Signed)
Your sutures need to be removed in 5 days.  You can go to an urgent care, the emergency department, or follow-up with your PCP for this.  Gently wash area with soap and water each day.

## 2023-10-13 NOTE — ED Provider Notes (Signed)
[place] EMERGENCY DEPARTMENT AT [facility] Provider Note   CSN: [id number] Arrival date & time: 10/13/23  1734     History  Chief Complaint  Patient presents with   Laceration    Joann Thompson is a 37 y.o. female.  37 year old female here today with a laceration to the dorsum of the left thumb.  Patient is right-hand dominant.  She says that she was cleaning a knife.   Laceration      Home Medications Prior to Admission medications   Medication Sig Start Date End Date Taking? Authorizing Provider  aspirin EC 81 MG tablet Take 81 mg by mouth daily.    [provider]  ferrous sulfate 325 (65 FE) MG tablet Take 325 mg by mouth daily with breakfast.    [provider]  ondansetron (ZOFRAN ODT) 4 MG disintegrating tablet Take 1 tablet (4 mg total) by mouth every 6 (six) hours as needed for nausea. 01/28/20   Aviva Signs, CNM  potassium chloride SA (KLOR-CON) 20 MEQ tablet Take 1 tablet (20 mEq total) by mouth daily. 01/28/20   Aviva Signs, CNM  Prenatal Vit-Fe Fumarate-FA (PRENATAL MULTIVITAMIN) TABS tablet Take 1 tablet by mouth daily at 12 noon.    [provider]  promethazine (PHENERGAN) 12.5 MG tablet Take by mouth every 6 (six) hours as needed for nausea or vomiting.    [provider]      Allergies    Patient has no known allergies.    Review of Systems   Review of Systems  Physical Exam Updated Vital Signs BP (!) 136/103 (BP Location: Right Arm)   Pulse 77   Temp 98.3 F (36.8 C)   Resp 17   Ht 4\' 11"  (1.499 m)   Wt 63.5 kg   SpO2 100%   BMI 28.27 kg/m  Physical Exam Vitals reviewed.  Musculoskeletal:        General: Normal range of motion.     Comments: Intact flexion, opposition, extension of the left thumb.  Skin:    Comments: There is a 1.8 cm laceration over the dorsum of the left thumb in between the CMC, MCP.  Neurological:     General: No focal deficit present.     ED Results /  Procedures / Treatments   Labs (all labs ordered are listed, but only abnormal results are displayed) Labs Reviewed - No data to display  EKG None  Radiology No results found.  Procedures .Marland KitchenLaceration Repair  Date/Time: 10/13/2023 8:37 PM  Performed by: Arletha Pili, DO Authorized by: Arletha Pili, DO   Consent:    Consent obtained:  Verbal   Risks discussed:  Infection and pain Universal protocol:    Patient identity confirmed:  Verbally with patient Anesthesia:    Anesthesia method:  Topical application   Topical anesthetic:  LET Laceration details:    Location:  Hand   Hand location:  L hand, dorsum   Length (cm):  1.8 Comments:     Let gel applied.  Area irrigated with 200 cc of sterile saline.  Suture selection was 4-0 Prolene.  I placed a horizontal mattress suture, and 1 simple interrupted suture.  Hemostasis was achieved.  Patient tolerated procedure well.     Medications Ordered in ED Medications  lidocaine-EPINEPHrine-tetracaine (LET) topical gel (3 mLs Topical Given 10/13/23 1944)  Tdap (BOOSTRIX) injection 0.5 mL (0.5 mLs Intramuscular Given 10/13/23 2016)    ED Course/ Medical Decision Making/ A&P  Medical Decision Making 37 year old female here today with thumb laceration.  Plan-there is no tendon involvement.  Appears to be a relatively superficial laceration.  No indication for plain films.  Tdap provided.  Laceration repaired.  Patient tolerated procedure well.  Amount and/or Complexity of Data Reviewed Radiology: ordered.          Final Clinical Impression(s) / ED Diagnoses Final diagnoses:  Laceration of left thumb without foreign body without damage to nail, initial encounter    Rx / DC Orders ED Discharge Orders     None         Arletha Pili, DO 10/13/23 2039

## 2023-10-13 NOTE — ED Triage Notes (Signed)
Pt states lac to left thumb with scraper while cleaning the stove Unknown TDAP

## 2023-10-19 ENCOUNTER — Emergency Department (HOSPITAL_BASED_OUTPATIENT_CLINIC_OR_DEPARTMENT_OTHER)
Admission: EM | Admit: 2023-10-19 | Discharge: 2023-10-19 | Disposition: A | Discharge status: Home / Self Care | Payer: Commercial Managed Care - PPO

## 2023-10-19 ENCOUNTER — Encounter (HOSPITAL_BASED_OUTPATIENT_CLINIC_OR_DEPARTMENT_OTHER): Payer: Self-pay

## 2023-10-19 ENCOUNTER — Other Ambulatory Visit: Payer: Self-pay

## 2023-10-19 DIAGNOSIS — Z4800 Encounter for change or removal of nonsurgical wound dressing: Secondary | ICD-10-CM | POA: Diagnosis present

## 2023-10-19 DIAGNOSIS — Z79899 Other long term (current) drug therapy: Secondary | ICD-10-CM | POA: Insufficient documentation

## 2023-10-19 DIAGNOSIS — Z7982 Long term (current) use of aspirin: Secondary | ICD-10-CM | POA: Diagnosis not present

## 2023-10-19 DIAGNOSIS — Z4802 Encounter for removal of sutures: Secondary | ICD-10-CM

## 2023-10-19 NOTE — Discharge Instructions (Addendum)
As discussed, there is concern for tendon injury of your left given your lack of range of motion.  Wear splint to keep thumb in neutral position.  I sent in a referral for hand surgery as well as given you information to call to schedule appointment at their office.  Recommend continue use of ibuprofen/Tylenol for pain.  Please do not hesitate to return to emergency department if there are worrisome signs and symptoms we discussed become apparent.

## 2023-10-19 NOTE — ED Provider Notes (Signed)
[place] EMERGENCY DEPARTMENT AT [facility] Provider Note   CSN: [id number] Arrival date & time: 10/19/23  1622     History  No chief complaint on file.   Joann Thompson is a 37 y.o. female.  HPI   37 year old female presents emergency department with request of suture removal.  Patient had 2 sutures placed on 10/13/2023 when she suffered laceration to the back of her left thumb.  Patient since laceration was repaired, has been unable to "give a thumbs up."  States this feels like that when the laceration occurred was hot but she would regain range of motion as the swelling decreased.  She states that it has not improved.  Also reports decrease sensation to the end of her left thumb.  Patient states that she is right-handed.  Past medical history significant for anemia  Home Medications Prior to Admission medications   Medication Sig Start Date End Date Taking? Authorizing Provider  aspirin EC 81 MG tablet Take 81 mg by mouth daily.    [provider]  ferrous sulfate 325 (65 FE) MG tablet Take 325 mg by mouth daily with breakfast.    [provider]  ondansetron (ZOFRAN ODT) 4 MG disintegrating tablet Take 1 tablet (4 mg total) by mouth every 6 (six) hours as needed for nausea. 01/28/20   Aviva Signs, CNM  potassium chloride SA (KLOR-CON) 20 MEQ tablet Take 1 tablet (20 mEq total) by mouth daily. 01/28/20   Aviva Signs, CNM  Prenatal Vit-Fe Fumarate-FA (PRENATAL MULTIVITAMIN) TABS tablet Take 1 tablet by mouth daily at 12 noon.    [provider]  promethazine (PHENERGAN) 12.5 MG tablet Take by mouth every 6 (six) hours as needed for nausea or vomiting.    [provider]      Allergies    Patient has no known allergies.    Review of Systems   Review of Systems  All other systems reviewed and are negative.   Physical Exam Updated Vital Signs BP 132/87   Pulse 89   Temp 98.2 F (36.8 C)   Resp 16   SpO2 98%   Physical Exam Vitals and nursing note reviewed.  Constitutional:      General: She is not in acute distress.    Appearance: She is well-developed.  HENT:     Head: Normocephalic and atraumatic.  Eyes:     Conjunctiva/sclera: Conjunctivae normal.  Cardiovascular:     Rate and Rhythm: Normal rate and regular rhythm.     Heart sounds: No murmur heard. Pulmonary:     Effort: Pulmonary effort is normal. No respiratory distress.     Breath sounds: Normal breath sounds.  Abdominal:     Palpations: Abdomen is soft.     Tenderness: There is no abdominal tenderness.  Musculoskeletal:        General: No swelling.     Cervical back: Neck supple.     Comments: Patient with thumb fixed in flexion at MCP joint of first digit of left hand.  Very minimal flexion extension appreciated of distal phalanx with inability to extend at MCP joint.  Skin where laceration was well-approximated without erythema, palpable fluctuance/induration.  Decreased sensation distally.  Skin:    General: Skin is warm and dry.     Capillary Refill: Capillary refill takes less than 2 seconds.  Neurological:     Mental Status: She is alert.  Psychiatric:        Mood and Affect: Mood normal.  ED Results / Procedures / Treatments   Labs (all labs ordered are listed, but only abnormal results are displayed) Labs Reviewed - No data to display  EKG None  Radiology No results found.  Procedures .Suture Removal  Date/Time: 10/19/2023 5:02 PM  Performed by: Peter Garter, PA Authorized by: Peter Garter, PA   Consent:    Consent obtained:  Verbal   Consent given by:  Patient   Risks, benefits, and alternatives were discussed: yes     Risks discussed:  Bleeding, wound separation and pain   Alternatives discussed:  No treatment, delayed treatment, observation, referral and alternative treatment Universal protocol:    Procedure explained and questions answered to patient or proxy's satisfaction: yes      Patient identity confirmed:  Verbally with patient Location:    Location:  Upper extremity   Upper extremity location:  Hand   Hand location:  L thumb Procedure details:    Wound appearance:  No signs of infection, clean and good wound healing   Number of sutures removed:  2 Post-procedure details:    Post-procedure assessment: Thumb spica splint.   Procedure completion:  Tolerated well, no immediate complications     Medications Ordered in ED Medications - No data to display  ED Course/ Medical Decision Making/ A&P                                 Medical Decision Making  This patient presents to the ED for concern of suture removal, this involves an extensive number of treatment options, and is a complaint that carries with it a high risk of complications and morbidity.  The differential diagnosis includes suture removal, wound dehiscence, cellulitis/abscess, other   Co morbidities that complicate the patient evaluation  See HPI   Additional history obtained:  Additional history obtained from EMR External records from outside source obtained and reviewed including hospital records   Lab Tests:  N/a   Imaging Studies ordered:  N/a   Cardiac Monitoring: / EKG:  The patient was maintained on a cardiac monitor.  I personally viewed and interpreted the cardiac monitored which showed an underlying rhythm of: Sinus rhythm   Consultations Obtained:  N/a   Problem List / ED Course / Critical interventions / Medication management  Suture removal, extensor tendon injury Reevaluation of the patient showed that the patient stayed the same I have reviewed the patients home medicines and have made adjustments as needed   Social Determinants of Health:  Denies tobacco, illicit drug use   Test / Admission - Considered:  Suture removal, extensor tendon injury Vitals signs within normal range and stable throughout visit. 37 year old female presents to the  emergency department with request of suture removal.  Prior wound well-approximated without signs of secondary infectious process.  Sutures removed in manner as above.  Patient is with decreased range of motion in flexion at MCP joint of affected digit as well as flexion and extension of IP joint.  Concern for underlying extensor tendon injury from initial laceration.  Patient seems to hold thumb fixed in flexion at MCP joint.  Will place patient in thumb spica splint in neutral position and give ambulatory referral for hand surgery for further assessment of patient's loss of range of motion.  Treatment plan discussed at length with patient and she acknowledged understanding was agreeable to said plan.  Patient overall well-appearing, afebrile in no acute distress. Worrisome  signs and symptoms were discussed with the patient, and the patient acknowledged understanding to return to the ED if noticed. Patient was stable upon discharge.          Final Clinical Impression(s) / ED Diagnoses Final diagnoses:  Visit for suture removal    Rx / DC Orders ED Discharge Orders          Ordered    Ambulatory referral to Hand Surgery       Comments: [facility] - Extensor tendon injury of left thumb from laceration on 10/21   10/19/23 1653              Peter Garter, PA 10/19/23 1704    Coral Spikes, DO 10/19/23 2316

## 2023-10-19 NOTE — ED Triage Notes (Signed)
Pt here for removal of stitches to L thumb. Denies infection/ healing issue.
